# Patient Record
Sex: Female | Born: 1975
Health system: Southern US, Community
[De-identification: ages and names within clinical notes are randomized; demographics above are authoritative.]

## PROBLEM LIST (undated history)

## (undated) DIAGNOSIS — J45909 Unspecified asthma, uncomplicated: Secondary | ICD-10-CM

## (undated) DIAGNOSIS — D649 Anemia, unspecified: Secondary | ICD-10-CM

## (undated) DIAGNOSIS — J40 Bronchitis, not specified as acute or chronic: Secondary | ICD-10-CM

## (undated) DIAGNOSIS — T7840XA Allergy, unspecified, initial encounter: Secondary | ICD-10-CM

## (undated) HISTORY — DX: Anemia, unspecified: D64.9

## (undated) HISTORY — PX: TUBAL LIGATION: SHX77

---

## 1998-10-06 ENCOUNTER — Ambulatory Visit (HOSPITAL_COMMUNITY): Admission: RE | Admit: 1998-10-06 | Discharge: 1998-10-06 | Payer: Self-pay | Admitting: Obstetrics and Gynecology

## 1998-11-29 ENCOUNTER — Encounter (HOSPITAL_COMMUNITY): Admission: RE | Admit: 1998-11-29 | Discharge: 1999-02-08 | Payer: Self-pay | Admitting: *Deleted

## 1999-01-11 ENCOUNTER — Inpatient Hospital Stay (HOSPITAL_COMMUNITY): Admission: AD | Admit: 1999-01-11 | Discharge: 1999-01-11 | Payer: Self-pay | Admitting: Obstetrics and Gynecology

## 1999-02-07 ENCOUNTER — Inpatient Hospital Stay (HOSPITAL_COMMUNITY): Admission: AD | Admit: 1999-02-07 | Discharge: 1999-02-09 | Payer: Self-pay | Admitting: Obstetrics and Gynecology

## 1999-09-12 ENCOUNTER — Emergency Department (HOSPITAL_COMMUNITY): Admission: EM | Admit: 1999-09-12 | Discharge: 1999-09-12 | Payer: Self-pay | Admitting: Emergency Medicine

## 1999-09-12 ENCOUNTER — Encounter: Payer: Self-pay | Admitting: Emergency Medicine

## 2000-04-24 ENCOUNTER — Emergency Department (HOSPITAL_COMMUNITY): Admission: EM | Admit: 2000-04-24 | Discharge: 2000-04-24 | Payer: Self-pay | Admitting: Emergency Medicine

## 2000-04-24 ENCOUNTER — Encounter: Payer: Self-pay | Admitting: Internal Medicine

## 2001-09-10 ENCOUNTER — Emergency Department (HOSPITAL_COMMUNITY): Admission: EM | Admit: 2001-09-10 | Discharge: 2001-09-10 | Payer: Self-pay | Admitting: Emergency Medicine

## 2002-02-11 ENCOUNTER — Emergency Department (HOSPITAL_COMMUNITY): Admission: EM | Admit: 2002-02-11 | Discharge: 2002-02-11 | Payer: Self-pay | Admitting: Emergency Medicine

## 2002-11-23 ENCOUNTER — Encounter: Payer: Self-pay | Admitting: Emergency Medicine

## 2002-11-23 ENCOUNTER — Emergency Department (HOSPITAL_COMMUNITY): Admission: EM | Admit: 2002-11-23 | Discharge: 2002-11-23 | Payer: Self-pay | Admitting: Emergency Medicine

## 2003-01-18 ENCOUNTER — Emergency Department (HOSPITAL_COMMUNITY): Admission: EM | Admit: 2003-01-18 | Discharge: 2003-01-18 | Payer: Self-pay | Admitting: Emergency Medicine

## 2003-04-29 ENCOUNTER — Other Ambulatory Visit: Admission: RE | Admit: 2003-04-29 | Discharge: 2003-04-29 | Payer: Self-pay | Admitting: Obstetrics and Gynecology

## 2003-05-10 ENCOUNTER — Ambulatory Visit (HOSPITAL_COMMUNITY): Admission: RE | Admit: 2003-05-10 | Discharge: 2003-05-10 | Payer: Self-pay | Admitting: Obstetrics and Gynecology

## 2004-02-23 ENCOUNTER — Emergency Department (HOSPITAL_COMMUNITY): Admission: EM | Admit: 2004-02-23 | Discharge: 2004-02-23 | Payer: Self-pay | Admitting: Emergency Medicine

## 2004-11-28 ENCOUNTER — Other Ambulatory Visit: Admission: RE | Admit: 2004-11-28 | Discharge: 2004-11-28 | Payer: Self-pay | Admitting: Obstetrics and Gynecology

## 2005-02-20 ENCOUNTER — Emergency Department (HOSPITAL_COMMUNITY): Admission: EM | Admit: 2005-02-20 | Discharge: 2005-02-20 | Payer: Self-pay | Admitting: Emergency Medicine

## 2005-05-25 ENCOUNTER — Emergency Department (HOSPITAL_COMMUNITY): Admission: EM | Admit: 2005-05-25 | Discharge: 2005-05-25 | Payer: Self-pay | Admitting: Emergency Medicine

## 2012-03-17 ENCOUNTER — Encounter (HOSPITAL_COMMUNITY): Payer: Self-pay | Admitting: *Deleted

## 2012-03-17 ENCOUNTER — Emergency Department (HOSPITAL_COMMUNITY)
Admission: EM | Admit: 2012-03-17 | Discharge: 2012-03-17 | Disposition: A | Discharge status: Home / Self Care | Payer: 59 | Attending: Emergency Medicine | Admitting: Emergency Medicine

## 2012-03-17 DIAGNOSIS — Y92838 Other recreation area as the place of occurrence of the external cause: Secondary | ICD-10-CM | POA: Insufficient documentation

## 2012-03-17 DIAGNOSIS — S43499A Other sprain of unspecified shoulder joint, initial encounter: Secondary | ICD-10-CM | POA: Insufficient documentation

## 2012-03-17 DIAGNOSIS — X503XXA Overexertion from repetitive movements, initial encounter: Secondary | ICD-10-CM | POA: Insufficient documentation

## 2012-03-17 DIAGNOSIS — Y998 Other external cause status: Secondary | ICD-10-CM | POA: Insufficient documentation

## 2012-03-17 DIAGNOSIS — S46819A Strain of other muscles, fascia and tendons at shoulder and upper arm level, unspecified arm, initial encounter: Secondary | ICD-10-CM

## 2012-03-17 DIAGNOSIS — Y9389 Activity, other specified: Secondary | ICD-10-CM | POA: Insufficient documentation

## 2012-03-17 DIAGNOSIS — Y9239 Other specified sports and athletic area as the place of occurrence of the external cause: Secondary | ICD-10-CM | POA: Insufficient documentation

## 2012-03-17 MED ORDER — IBUPROFEN 800 MG PO TABS: 800.0000 mg | ORAL_TABLET | Freq: Three times a day (TID) | ORAL | Status: AC | PRN

## 2012-03-17 MED ORDER — CYCLOBENZAPRINE HCL 10 MG PO TABS: 10.0000 mg | ORAL_TABLET | Freq: Three times a day (TID) | ORAL | Status: AC | PRN

## 2012-03-17 NOTE — ED Provider Notes (Signed)
Medical screening examination/treatment/procedure(s) were performed by non-physician practitioner and as supervising physician I was immediately available for consultation/collaboration.  Olivia Mackie, MD 03/17/12 2134

## 2012-03-17 NOTE — ED Provider Notes (Signed)
History     CSN: [id number]  Arrival date & time 03/17/12  0159   First MD Initiated Contact with Patient 03/17/12 0515      Chief Complaint  Patient presents with  . Neck Pain   HPI  History provided by the patient. Patient is a 36 year old female with no significant PMH who presents with complaints of left-sided neck back pain. Patient states pain began at 8 AM yesterday morning and has gradually increased throughout the day. Patient has occasional sharp pains radiating down left shoulder and posterior arm near the triceps. She denies any numbness or weakness of the hand. Patient denies having any similar symptoms previously. Patient works for [employer] and reports performing repetitive motions and lifting with both arms as well as repetitive bending of the neck. She has never had similar symptoms previously from working. Patient is unsure if she had any abnormal sleep the night before her symptoms began. Patient has tried to rest the area without significant improvement. She denies any other treatments. She denies any other associated symptoms.    History reviewed. No pertinent past medical history.  Past Surgical History  Procedure Date  . Tubal ligation     History reviewed. No pertinent family history.  History  Substance Use Topics  . Smoking status: Never Smoker   . Smokeless tobacco: Not on file  . Alcohol Use: No    OB History    Grav Para Term Preterm Abortions TAB SAB Ect Mult Living                  Review of Systems  Constitutional: Negative for fever and chills.  HENT: Positive for neck pain. Negative for neck stiffness.   Eyes: Negative for visual disturbance.  Respiratory: Negative for shortness of breath.   Gastrointestinal: Negative for nausea, vomiting and abdominal pain.  Musculoskeletal: Positive for back pain.  Neurological: Negative for weakness, light-headedness, numbness and headaches.    Allergies  Review of patient's allergies  indicates no known allergies.  Home Medications  No current outpatient prescriptions on file.  BP 111/70  Pulse 64  Temp 98.2 F (36.8 C) (Oral)  Resp 18  SpO2 100%  LMP 02/24/2012  Physical Exam  Nursing note and vitals reviewed. Constitutional: She is oriented to person, place, and time. She appears well-developed and well-nourished. No distress.  HENT:  Head: Normocephalic.  Neck: Normal range of motion. Neck supple.    Cardiovascular: Normal rate and regular rhythm.   No murmur heard. Pulmonary/Chest: Effort normal and breath sounds normal. No respiratory distress. She has no wheezes. She has no rales.  Musculoskeletal:       Cervical back: She exhibits normal range of motion and no bony tenderness.       Back:  Neurological: She is alert and oriented to person, place, and time. She has normal strength. No sensory deficit.       Normal and equal strength of upper extremities bilaterally.  Skin: Skin is warm and dry. No rash noted. No erythema.  Psychiatric: She has a normal mood and affect. Her behavior is normal.    ED Course  Procedures     1. Trapezius muscle strain       MDM  5:20 AM patient seen and evaluated. Patient with no significant injury or trauma. Patient does do repetitive movements and lifting with both arms and frequent bending of neck. Patient is not have any spinal tenderness. She has tenderness along left trapezius. No neurologic  deficits of left hand or arm. This time suspect muscle irritation and strain. Will give symptomatic treatment.        Angus Seller, [place] 03/17/12 [id number]

## 2012-03-17 NOTE — Discharge Instructions (Signed)
You were seen and evaluated for your left neck and back pains. At this time your providers do not feel your symptoms cause from any concerning condition. Your providers to feel you have muscle strain and soreness. You've been given medications to help with symptoms. Use heat and ice for comfort. Please followup with a primary care provider.   Muscle Strain A muscle strain, or pulled muscle, occurs when a muscle is over-stretched. A small number of muscle fibers may also be torn. This is especially common in athletes. This happens when a sudden violent force placed on a muscle pushes it past its capacity. Usually, recovery from a pulled muscle takes 1 to 2 weeks. But complete healing will take 5 to 6 weeks. There are millions of muscle fibers. Following injury, your body will usually return to normal quickly. HOME CARE INSTRUCTIONS   While awake, apply ice to the sore muscle for 15 to 20 minutes each hour for the first 2 days. Put ice in a plastic bag and place a towel between the bag of ice and your skin.   Do not use the pulled muscle for several days. Do not use the muscle if you have pain.   You may wrap the injured area with an elastic bandage for comfort. Be careful not to bind it too tightly. This may interfere with blood circulation.   Only take over-the-counter or prescription medicines for pain, discomfort, or fever as directed by your caregiver. Do not use aspirin as this will increase bleeding (bruising) at injury site.   Warming up before exercise helps prevent muscle strains.  SEEK MEDICAL CARE IF:  There is increased pain or swelling in the affected area. MAKE SURE YOU:   Understand these instructions.   Will watch your condition.   Will get help right away if you are not doing well or get worse.  Document Released: 06/18/2005 Document Revised: 06/07/2011 Document Reviewed: 01/15/2007 [facility] Patient Information 2012 [place], [place].     RESOURCE GUIDE  Chronic Pain  Problems: Contact [facility]  [phone] Patients need to be referred by their primary care doctor.  Insufficient Money for Medicine: Contact United Way:  call "211" or [facility] [phone].  No Primary Care Doctor: - Call [facility]  [phone] - can help you locate a primary care doctor that  accepts your insurance, provides certain services, etc. - Physician Referral [phone]  Agencies that provide inexpensive medical care: - [facility]  [phone] - [facility]  [phone] - [facility]  [phone] [facility]  [phone] - [facility]  [phone] [facility]  [phone]  Medicaid-accepting [facility] Providers: - [facility]- [address]  [phone], Mon-Fri 9am-7pm, Sat 9am-1pm - [facility]- [address] [place], [address] [place]  [phone] - [facility]- [address], [address] [place]  [phone] [facility]- [address]  [phone] - Renaye Rakers- [address] [place], [address], [phone]  Only accepts [place] patients after they have their name  applied to their card  Self Pay (no insurance) in [place]: - Sickle Cell Patients: Dr Willey Blade, [facility]  [address] [place], [phone] - [facility]- [address] [place]  [phone]       [facility] [phone] [place]  [address]       -     [place] [employer]- see information above (Speak to [employer] if you do not have insurance)       -  [facility]- [address] [place], [phone]       -  [facility]- [address],  [phone]       -  [facility]- [address], [phone]       -  Dr Julio Sicks-  [address], [place], [phone]       -  [facility]- [address] [phone]       -   [facility]- [address] [phone], also [address] [phone]       -    [facility]- [address] [place], [phone], 1st & 3rd Saturday   every month, 10am-1pm  1) Find a Doctor and Pay Out of Pocket Although you won't have to find out who is covered by your insurance plan, it is a good idea to ask around and get recommendations. You will then need to call the office and see if the doctor you have chosen will accept you as a new patient and what types of options they offer for patients who are self-pay. Some doctors offer discounts or will set up payment plans for their patients who do not have insurance, but you will need to ask so you aren't surprised when you get to your appointment.  2) Contact Your Local Health Department Not all health departments have doctors that can see patients for sick visits, but many do, so it is worth a call to see if yours does. If you don't know where your local health department is, you can check in your phone book. The CDC also has a tool to help you locate your state's health department, and many state websites also have listings of all of their local health departments.  3) Find a Walk-in Clinic If your illness is not likely to be very severe or complicated, you may want to try a walk in clinic. These are popping up all over the country in pharmacies, drugstores, and shopping centers. They're usually staffed by nurse practitioners or physician assistants that have been trained to treat common illnesses and complaints. They're usually fairly quick and inexpensive. However, if you have serious medical issues or chronic medical problems, these are probably not your best option  STD Testing - [facility] Department of [facility], STD Clinic, [address] [place], phone [phone] or [phone].  Monday - Friday, call for an appointment. [facility] - [facility] Department of [employer],  STD Clinic, [address], [place], phone [phone] or [phone].  Monday - Friday, call for an appointment.  Abuse/Neglect: [facility] Child Abuse Hotline (810) 326-8285 [address] Child Abuse Hotline (579) 652-1887 (After Hours)  Emergency Shelter:  [facility] (904)698-8124  Maternity Homes: - [facility] [place] [facility] (445) 122-2353 - [facility] [phone]  MRSA Hotline #:   220-636-2541  [facility] Resources  [facility] [place]  [employer] [facility]. [address]                 [address]         [address] [place] [address] [place]  Blondell Reveal Phone:  [phone]                                  Phone:  [phone]                   Phone:  [phone]  [facility], [phone] - [facility] - [phone]       -     [facility] in [place], [address]                                  9151444992, [facility] Child Abuse Hotline (949)172-9116 or 805 137 4789 (After Hours)   Behavioral Health Services  Substance Abuse Resources: - [facility] and Drug Services  ([phone] - [facility] [phone] - [facility] [place] [phone] Floydene Flock [phone] - Residential & Outpatient Substance Abuse Program  ([phone]  Psychological Services: [facility]  [phone] [facility] Services  [phone] - [facility], [phone] [place]. [address], [place], ACCESS LINE: [phone] or [phone], [employer]za  Dental Assistance  If unable to pay or uninsured, contact:  [facility] or [facility]. to become qualified for the adult dental clinic.  Patients with Medicaid: [facility] [phone] [address], [phone] [address] [phone]  If unable to pay, or uninsured, contact [facility] [phone]) or [facility] [phone] in [place], [phone] in [facility]) to become qualified for the adult dental clinic  Other Low-Cost Community Dental Services: - [facility]- [address] [place], [place], [place], [address], [phone], [address]. [phone], 2nd and 4th Thursday of the month at 6:30am.  10 clients each day by appointment, can sometimes see walk-in patients if someone does not show for an appointment. [facility]- [address] [place], [place], [address], [phone] - [facility]- [address] [place], [place], [address], [phone] - [facility]- [phone] [facility] Department- [phone] [facility] : [facility] Department- [phone]

## 2012-03-17 NOTE — ED Notes (Signed)
Pt c/o L neck pain radiating down L arm and L back. Pt works for [employer], does a lot of lifting, denies overt injury at this time.

## 2012-03-21 ENCOUNTER — Emergency Department (HOSPITAL_COMMUNITY)
Admission: EM | Admit: 2012-03-21 | Discharge: 2012-03-22 | Disposition: A | Discharge status: Home / Self Care | Payer: 59 | Attending: Emergency Medicine | Admitting: Emergency Medicine

## 2012-03-21 DIAGNOSIS — M542 Cervicalgia: Secondary | ICD-10-CM

## 2012-03-21 DIAGNOSIS — M25519 Pain in unspecified shoulder: Secondary | ICD-10-CM | POA: Insufficient documentation

## 2012-03-21 DIAGNOSIS — M79609 Pain in unspecified limb: Secondary | ICD-10-CM | POA: Insufficient documentation

## 2012-03-21 NOTE — ED Notes (Signed)
Pt c/o pain to L side of neck that radiates down L side and L arm since Monday. Pt states she works at [employer] and does a lot of pushing, pulling and lifting and thinks she may have injured herself at work. Pt was seen in ED for same on Monday. Pt was given Flexeril and Motrin and she states this only works temporarily. ROM intact to L side.

## 2012-03-22 MED ORDER — TRAMADOL HCL 50 MG PO TABS: 50.0000 mg | ORAL_TABLET | Freq: Four times a day (QID) | ORAL | Status: DC | PRN

## 2012-03-22 MED ORDER — OXYCODONE-ACETAMINOPHEN 5-325 MG PO TABS
2.0000 | ORAL_TABLET | Freq: Once | ORAL | Status: DC
  Filled 2012-03-22: qty 2

## 2012-03-22 MED ORDER — METHOCARBAMOL 100 MG/ML IJ SOLN
500.0000 mg | Freq: Once | INTRAMUSCULAR | Status: AC
  Administered 2012-03-22: 500 mg via INTRAMUSCULAR
  Filled 2012-03-22: qty 5

## 2012-03-22 MED ORDER — OXYCODONE-ACETAMINOPHEN 5-325 MG PO TABS: 1.0000 | ORAL_TABLET | ORAL | Status: DC | PRN

## 2012-03-22 MED ORDER — METHOCARBAMOL 500 MG PO TABS: 500.0000 mg | ORAL_TABLET | Freq: Two times a day (BID) | ORAL | Status: DC

## 2012-03-22 MED ORDER — METHOCARBAMOL 100 MG/ML IJ SOLN: 500.0000 mg | Freq: Once | INTRAMUSCULAR | Status: DC

## 2012-03-22 NOTE — ED Notes (Signed)
Pt instructed to lay supine for 15 min as per Pharmacy recommendations.

## 2012-03-22 NOTE — ED Provider Notes (Signed)
History     CSN: [id number]  Arrival date & time 03/21/12  2009   First MD Initiated Contact with Patient 03/21/12 2250      Chief Complaint  Patient presents with  . Neck Pain    (Consider location/radiation/quality/duration/timing/severity/associated sxs/prior treatment) HPI Comments: Marie Kramer 36 y.o. female   The chief complaint is: Patient presents with:   Neck Pain   The patient has medical history significant for:   No past medical history on file.  Patient presents with left neck pain with transmission to the left shoulder and arm. 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 Patient states she took some flexeril and motrin with some improvement. Denies fever or chills. Denies NVD or abdominal pain. Denies numbness or tingling. Denies headache. Denies recent head or neck trauma.      The history is provided by the patient.    No past medical history on file.  Past Surgical History  Procedure Date  . Tubal ligation     No family history on file.  History  Substance Use Topics  . Smoking status: Never Smoker   . Smokeless tobacco: Not on file  . Alcohol Use: No    OB History    Grav Para Term Preterm Abortions TAB SAB Ect Mult Living                  Review of Systems  Constitutional: Negative for fever and chills.  HENT: Positive for neck pain.   Gastrointestinal: Negative for nausea, vomiting, abdominal pain and diarrhea.  Musculoskeletal: Positive for arthralgias.  Neurological: Negative for headaches.  All other systems reviewed and are negative.    Allergies  Review of patient's allergies indicates no known allergies.  Home Medications   Current Outpatient Rx  Name Route Sig Dispense Refill  . CYCLOBENZAPRINE HCL 10 MG PO TABS Oral Take 1 tablet (10 mg total) by mouth 3 (three) times daily as needed for muscle spasms. 30 tablet 0  . IBUPROFEN 800 MG PO  TABS Oral Take 1 tablet (800 mg total) by mouth every 8 (eight) hours as needed for pain. 30 tablet 0    BP 99/72  Pulse 58  Temp 98.6 F (37 C) (Oral)  Resp 16  SpO2 100%  LMP 02/24/2012  Physical Exam  Vitals reviewed. Constitutional: She appears well-developed and well-nourished. She appears distressed.  HENT:  Head: Normocephalic and atraumatic.  Mouth/Throat: Oropharynx is clear and moist.  Eyes: Conjunctivae normal and EOM are normal. Pupils are equal, round, and reactive to light. No scleral icterus.  Neck: Normal range of motion. Neck supple.  Cardiovascular: Regular rhythm and normal heart sounds.   Pulmonary/Chest: Effort normal.  Abdominal: Soft. Bowel sounds are normal. There is no tenderness.  Musculoskeletal: Normal range of motion. She exhibits tenderness.       Tenderness to palpation of the lateral aspect of left side of neck. Tenderness to palpation of left shoulder.  Neurological: She is alert.  Skin: Skin is warm and dry.    ED Course  Procedures (including critical care time)  Labs Reviewed - No data to display No results found.   1. Neck pain       MDM  Patient presented with neck pain secondary to strain at work. Patient given muscle relaxer in ED with some improvement. Patient discharged on pain medication and muscle relaxer. No red flags for meningitis or  cervical fracture. Return precautions given verbally and in discharge summary.        Pixie Casino, PA-C 03/22/12 [phone]

## 2012-03-23 NOTE — ED Provider Notes (Signed)
Medical screening examination/treatment/procedure(s) were performed by non-physician practitioner and as supervising physician I was immediately available for consultation/collaboration.  Donnetta Hutching, MD 03/23/12 [phone]

## 2012-12-05 ENCOUNTER — Encounter (HOSPITAL_COMMUNITY): Payer: Self-pay | Admitting: Emergency Medicine

## 2012-12-05 ENCOUNTER — Ambulatory Visit (HOSPITAL_COMMUNITY)
Admit: 2012-12-05 | Discharge: 2012-12-05 | Disposition: A | Discharge status: Home / Self Care | Payer: 59 | Attending: Emergency Medicine | Admitting: Emergency Medicine

## 2012-12-05 ENCOUNTER — Emergency Department (HOSPITAL_COMMUNITY)
Admission: EM | Admit: 2012-12-05 | Discharge: 2012-12-05 | Disposition: A | Discharge status: Home / Self Care | Payer: 59 | Source: Home / Self Care | Attending: Emergency Medicine | Admitting: Emergency Medicine

## 2012-12-05 DIAGNOSIS — R609 Edema, unspecified: Secondary | ICD-10-CM

## 2012-12-05 DIAGNOSIS — R600 Localized edema: Secondary | ICD-10-CM

## 2012-12-05 DIAGNOSIS — M7989 Other specified soft tissue disorders: Secondary | ICD-10-CM | POA: Insufficient documentation

## 2012-12-05 LAB — POCT I-STAT, CHEM 8
BUN: 12 mg/dL (ref 6–23)
Calcium, Ion: 1.3 mmol/L — ABNORMAL HIGH (ref 1.12–1.23)
Creatinine, Ser: 0.7 mg/dL (ref 0.50–1.10)
Glucose, Bld: 87 mg/dL (ref 70–99)
Potassium: 4 mEq/L (ref 3.5–5.1)
TCO2: 28 mmol/L (ref 0–100)

## 2012-12-05 LAB — POCT URINALYSIS DIP (DEVICE)
Ketones, ur: NEGATIVE mg/dL
pH: 6 (ref 5.0–8.0)

## 2012-12-05 LAB — D-DIMER, QUANTITATIVE: D-Dimer, Quant: 0.58 ug/mL-FEU — ABNORMAL HIGH (ref 0.00–0.48)

## 2012-12-05 NOTE — ED Notes (Signed)
Patient going for [place] and will return to [facility]

## 2012-12-05 NOTE — ED Provider Notes (Signed)
Medical screening examination/treatment/procedure(s) were performed by non-physician practitioner and as supervising physician I was immediately available for consultation/collaboration.  Leslee Home, M.D.  Reuben Likes, MD 12/05/12 2209

## 2012-12-05 NOTE — ED Provider Notes (Signed)
History     CSN: [id number]  Arrival date & time 12/05/12  1446   First MD Initiated Contact with Patient 12/05/12 1759      Chief Complaint  Patient presents with  . Leg Swelling    (Consider location/radiation/quality/duration/timing/severity/associated sxs/prior treatment) HPI Comments: Pt denies injury.  Reports BLE swelling, worse in RLE. Only pain is in r ankle with standing and walking.  Swelling is from knees to feet.   Patient is a 37 y.o. female presenting with extremity pain. The history is provided by the patient.  Extremity Pain This is a new problem. Episode onset: 1 week ago. The problem occurs constantly. The problem has been gradually worsening. The symptoms are aggravated by walking and standing. Relieved by: elevating lower legs. Treatments tried: elevating lower legs. Improvement on treatment: moderate transient improvement.    History reviewed. No pertinent past medical history.  Past Surgical History  Procedure Laterality Date  . Tubal ligation      History reviewed. No pertinent family history.  History  Substance Use Topics  . Smoking status: Never Smoker   . Smokeless tobacco: Not on file  . Alcohol Use: No    OB History   Grav Para Term Preterm Abortions TAB SAB Ect Mult Living                  Review of Systems  Constitutional: Negative for fever and chills.  Musculoskeletal:       R ankle pain, BLE swelling  Neurological: Negative for weakness and numbness.    Allergies  Review of patient's allergies indicates no known allergies.  Home Medications   Current Outpatient Rx  Name  Route  Sig  Dispense  Refill  . methocarbamol (ROBAXIN) 500 MG tablet   Oral   Take 1 tablet (500 mg total) by mouth 2 (two) times daily.   20 tablet   0   . oxyCODONE-acetaminophen (PERCOCET/ROXICET) 5-325 MG per tablet   Oral   Take 1 tablet by mouth every 4 (four) hours as needed for pain.   15 tablet   0   . traMADol (ULTRAM) 50 MG tablet  Oral   Take 1 tablet (50 mg total) by mouth every 6 (six) hours as needed for pain.   15 tablet   0     BP 116/74  Pulse 61  Temp(Src) 98.1 F (36.7 C) (Oral)  Resp 17  SpO2 98%  LMP 12/05/2012  Physical Exam  Constitutional: She appears well-developed and well-nourished. No distress.  Cardiovascular:  Pulses:      Dorsalis pedis pulses are 2+ on the right side, and 2+ on the left side.  Musculoskeletal:       Right ankle: She exhibits swelling. She exhibits normal range of motion, no deformity and normal pulse. Tenderness.       Right lower leg: She exhibits tenderness and edema. She exhibits no bony tenderness.       Left lower leg: She exhibits tenderness and edema. She exhibits no bony tenderness.  Skin: Skin is warm, dry and intact. No erythema.  No wounds, erythema or warmth to BLE    ED Course  Procedures (including critical care time)  Labs Reviewed  D-DIMER, QUANTITATIVE - Abnormal; Notable for the following:    D-Dimer, Quant 0.58 (*)    All other components within normal limits  POCT I-STAT, CHEM 8 - Abnormal; Notable for the following:    Calcium, Ion 1.30 (*)    All other components  within normal limits  POCT URINALYSIS DIP (DEVICE) - Abnormal; Notable for the following:    Hgb urine dipstick TRACE (*)    All other components within normal limits   No results found.   1. Lower extremity edema       MDM  D-dimer positive, pt sent for B venous doppler of BLE which were negative. Discussed home mgmt measures of venous stasis.         Cathlyn Parsons, NP 12/05/12 2141

## 2012-12-05 NOTE — ED Notes (Signed)
Right leg and ankle swelling and pain.  Patient has been elevating leg, swelling does go down, but with dependant positioning, swelling increases.  Denies injury.

## 2012-12-15 NOTE — Progress Notes (Signed)
VASCULAR LAB PRELIMINARY  PRELIMINARY  PRELIMINARY  PRELIMINARY  Bilateral lower extremity venous duplex completed.    Preliminary report:  Bilateral:  No evidence of DVT, superficial thrombosis, or Baker's Cyst.   Charlette Hennings, RVS 12/15/2012, 11:52 AM

## 2013-09-06 ENCOUNTER — Emergency Department (HOSPITAL_COMMUNITY): Payer: 59

## 2013-09-06 ENCOUNTER — Encounter (HOSPITAL_COMMUNITY): Payer: Self-pay | Admitting: Emergency Medicine

## 2013-09-06 ENCOUNTER — Emergency Department (HOSPITAL_COMMUNITY)
Admission: EM | Admit: 2013-09-06 | Discharge: 2013-09-06 | Disposition: A | Discharge status: Home / Self Care | Payer: 59 | Attending: Emergency Medicine | Admitting: Emergency Medicine

## 2013-09-06 DIAGNOSIS — J45909 Unspecified asthma, uncomplicated: Secondary | ICD-10-CM | POA: Insufficient documentation

## 2013-09-06 DIAGNOSIS — J40 Bronchitis, not specified as acute or chronic: Secondary | ICD-10-CM

## 2013-09-06 DIAGNOSIS — J3489 Other specified disorders of nose and nasal sinuses: Secondary | ICD-10-CM | POA: Insufficient documentation

## 2013-09-06 HISTORY — DX: Unspecified asthma, uncomplicated: J45.909

## 2013-09-06 MED ORDER — AZITHROMYCIN 250 MG PO TABS: 250.0000 mg | ORAL_TABLET | Freq: Every day | ORAL | Status: DC

## 2013-09-06 MED ORDER — AZITHROMYCIN 250 MG PO TABS
500.0000 mg | ORAL_TABLET | Freq: Once | ORAL | Status: AC
  Administered 2013-09-06: 500 mg via ORAL
  Filled 2013-09-06: qty 2

## 2013-09-06 MED ORDER — HYDROCOD POLST-CHLORPHEN POLST 10-8 MG/5ML PO LQCR: 5.0000 mL | Freq: Two times a day (BID) | ORAL | Status: DC | PRN

## 2013-09-06 NOTE — ED Provider Notes (Signed)
CSN: [id number]     Arrival date & time 09/06/13  2059 History   This chart was scribed for non-physician practitioner Arman Filter, NP, working with Doug Sou, MD, by Yevette Edwards, ED Scribe. This patient was seen in room WTR5/WTR5 and the patient's care was started at 10:09 PM. None    Chief Complaint  Patient presents with  . Cough    The history is provided by the patient. No language interpreter was used.   HPI Comments: Marie Kramer is a 38 y.o. female who presents to the Emergency Department complaining of a cough which has persisted for three weeks. The pt reports the cough is intermittently productive of yellowish brown phlegm. She has also experienced rhinorrhea and a fever two days ago. In the ED, her temperature is 98.2 F.She has treated the symptoms with Delsym Cough and Congestion without relief; she has been using the OTC for two weeks. The pt has a h/o asthma.   Past Medical History  Diagnosis Date  . Asthma    Past Surgical History  Procedure Laterality Date  . Tubal ligation     History reviewed. No pertinent family history. History  Substance Use Topics  . Smoking status: Never Smoker   . Smokeless tobacco: Not on file  . Alcohol Use: No   No OB history provided.  Review of Systems  Constitutional: Positive for fever (Resolved.).  HENT: Positive for rhinorrhea.   Respiratory: Positive for cough. Negative for shortness of breath and wheezing.   Gastrointestinal: Negative for vomiting.  All other systems reviewed and are negative.   Allergies  Review of patient's allergies indicates no known allergies.  Home Medications   Current Outpatient Rx  Name  Route  Sig  Dispense  Refill  . dextromethorphan (DELSYM) 30 MG/5ML liquid   Oral   Take 15 mg by mouth as needed for cough.         Marland Kitchen azithromycin (ZITHROMAX) 250 MG tablet   Oral   Take 1 tablet (250 mg total) by mouth daily.   4 tablet   0   . chlorpheniramine-HYDROcodone (TUSSIONEX  PENNKINETIC ER) 10-8 MG/5ML LQCR   Oral   Take 5 mLs by mouth every 12 (twelve) hours as needed for cough.   60 mL   0    Triage Vitals: BP 109/67  Pulse 75  Temp(Src) 98.2 F (36.8 C) (Oral)  Resp 20  Ht 5\' 3"  (1.6 m)  Wt 195 lb (88.451 kg)  BMI 34.55 kg/m2  SpO2 100%  LMP 08/21/2013  Physical Exam  Nursing note and vitals reviewed. Constitutional: She is oriented to person, place, and time. She appears well-developed and well-nourished. No distress.  HENT:  Head: Normocephalic and atraumatic.  Right Ear: External ear normal.  Left Ear: External ear normal.  Nose: Rhinorrhea present. Right sinus exhibits no maxillary sinus tenderness and no frontal sinus tenderness. Left sinus exhibits no maxillary sinus tenderness and no frontal sinus tenderness.  Mouth/Throat: Oropharynx is clear and moist.  Eyes: Conjunctivae and EOM are normal. Right eye exhibits no discharge. Left eye exhibits no discharge.  Neck: Neck supple. No tracheal deviation present.  Cardiovascular: Normal rate, regular rhythm and normal heart sounds.  Exam reveals no gallop and no friction rub.   No murmur heard. Pulmonary/Chest: Effort normal and breath sounds normal. No respiratory distress. She has no wheezes. She has no rales. She exhibits no tenderness.  Musculoskeletal: Normal range of motion.  Neurological: She is alert and oriented  to person, place, and time.  Skin: Skin is warm and dry.  Psychiatric: She has a normal mood and affect. Her behavior is normal.     ED Course  Procedures (including critical care time)  DIAGNOSTIC STUDIES: Oxygen Saturation is 100% on room air, normal by my interpretation.    COORDINATION OF CARE:  10:13 PM- Discussed treatment plan with patient, which includes a chest x-ray, and the patient agreed to the plan.   Labs Review Labs Reviewed - No data to display Imaging Review Dg Chest 2 View  09/06/2013   CLINICAL DATA:  Cough.  EXAM: CHEST  2 VIEW  COMPARISON:   None.  FINDINGS: The heart size and mediastinal contours are within normal limits. Both lungs are clear. No pleural effusion or pneumothorax is noted. The visualized skeletal structures are unremarkable.  IMPRESSION: No acute cardiopulmonary abnormality seen.   Electronically Signed   By: Roque LiasJames  Green M.D.   On: 09/06/2013 23:22     EKG Interpretation None     Due to stated fevers and persistent cough with negative xray will treat with Z pack  MDM   Final diagnoses:  Bronchitis       I personally performed the services described in this documentation, which was scribed in my presence. The recorded information has been reviewed and is accurate.   Arman FilterGail K Gunhild Bautch, NP 09/06/13 [id number]

## 2013-09-06 NOTE — ED Notes (Signed)
Pt arrived to the ED with a complaint of a cough that has been persistent for three weeks.  Pt states she has been using OTC medications without relief.  Pt states the cough is minimally productive with yellow brown fleam.

## 2013-09-06 NOTE — Discharge Instructions (Signed)
Bronchitis °Bronchitis is swelling (inflammation) of the air tubes leading to your lungs (bronchi). This causes mucus and a cough. If the swelling gets bad, you may have trouble breathing. °HOME CARE  °· Rest. °· Drink enough fluids to keep your pee (urine) clear or pale yellow (unless you have a condition where you have to watch how much you drink). °· Only take medicine as told by your doctor. If you were given antibiotic medicines, finish them even if you start to feel better. °· Avoid smoke, irritating chemicals, and strong smells. These make the problem worse. Quit smoking if you smoke. This helps your lungs heal faster. °· Use a cool mist humidifier. Change the water in the humidifier every day. You can also sit in the bathroom with hot shower running for 5 10 minutes. Keep the door closed. °· See your health care provider as told. °· Wash your hands often. °GET HELP IF: °Your problems do not get better after 1 week. °GET HELP RIGHT AWAY IF:  °· Your fever gets worse. °· You have chills. °· Your chest hurts. °· Your problems breathing get worse. °· You have blood in your mucus. °· You pass out (faint). °· You feel lightheaded. °· You have a bad headache. °· You throw up (vomit) again and again. °MAKE SURE YOU: °· Understand these instructions. °· Will watch your condition. °· Will get help right away if you are not doing well or get worse. °Document Released: 12/05/2007 Document Revised: 04/08/2013 Document Reviewed: 02/10/2013 °[employer]® Patient Information ©2014 [employer]. ° °

## 2013-09-07 NOTE — ED Provider Notes (Signed)
Medical screening examination/treatment/procedure(s) were performed by non-physician practitioner and as supervising physician I was immediately available for consultation/collaboration.   EKG Interpretation None       Doug SouSam Jezlyn Westerfield, MD 09/07/13 [id number]

## 2014-06-06 IMAGING — CR DG CHEST 2V
2 series · 2 of 2 positions shown · non-contrast
Comparison: None.

CLINICAL DATA: Cough.

EXAM:
CHEST  2 VIEW

[w chest pa]
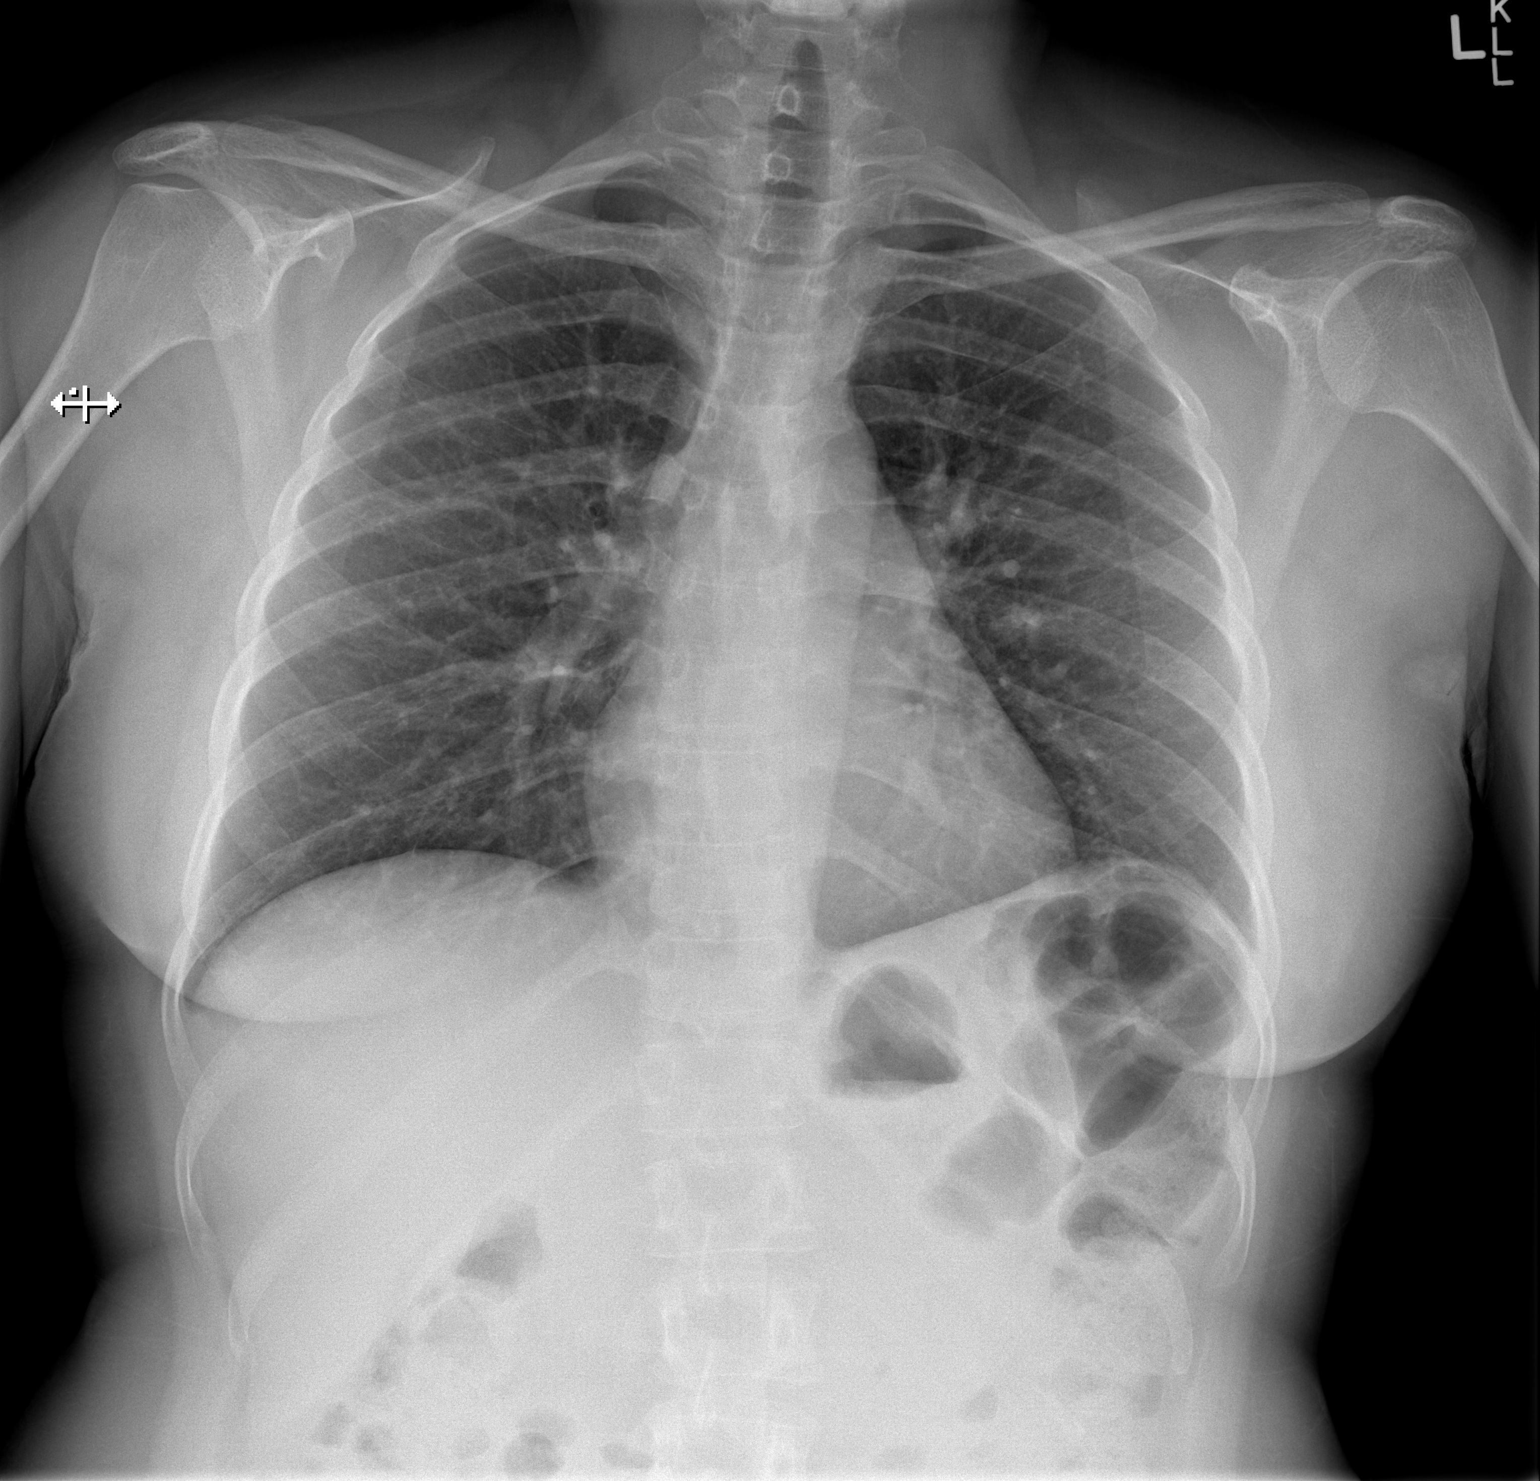

[w chest lat]
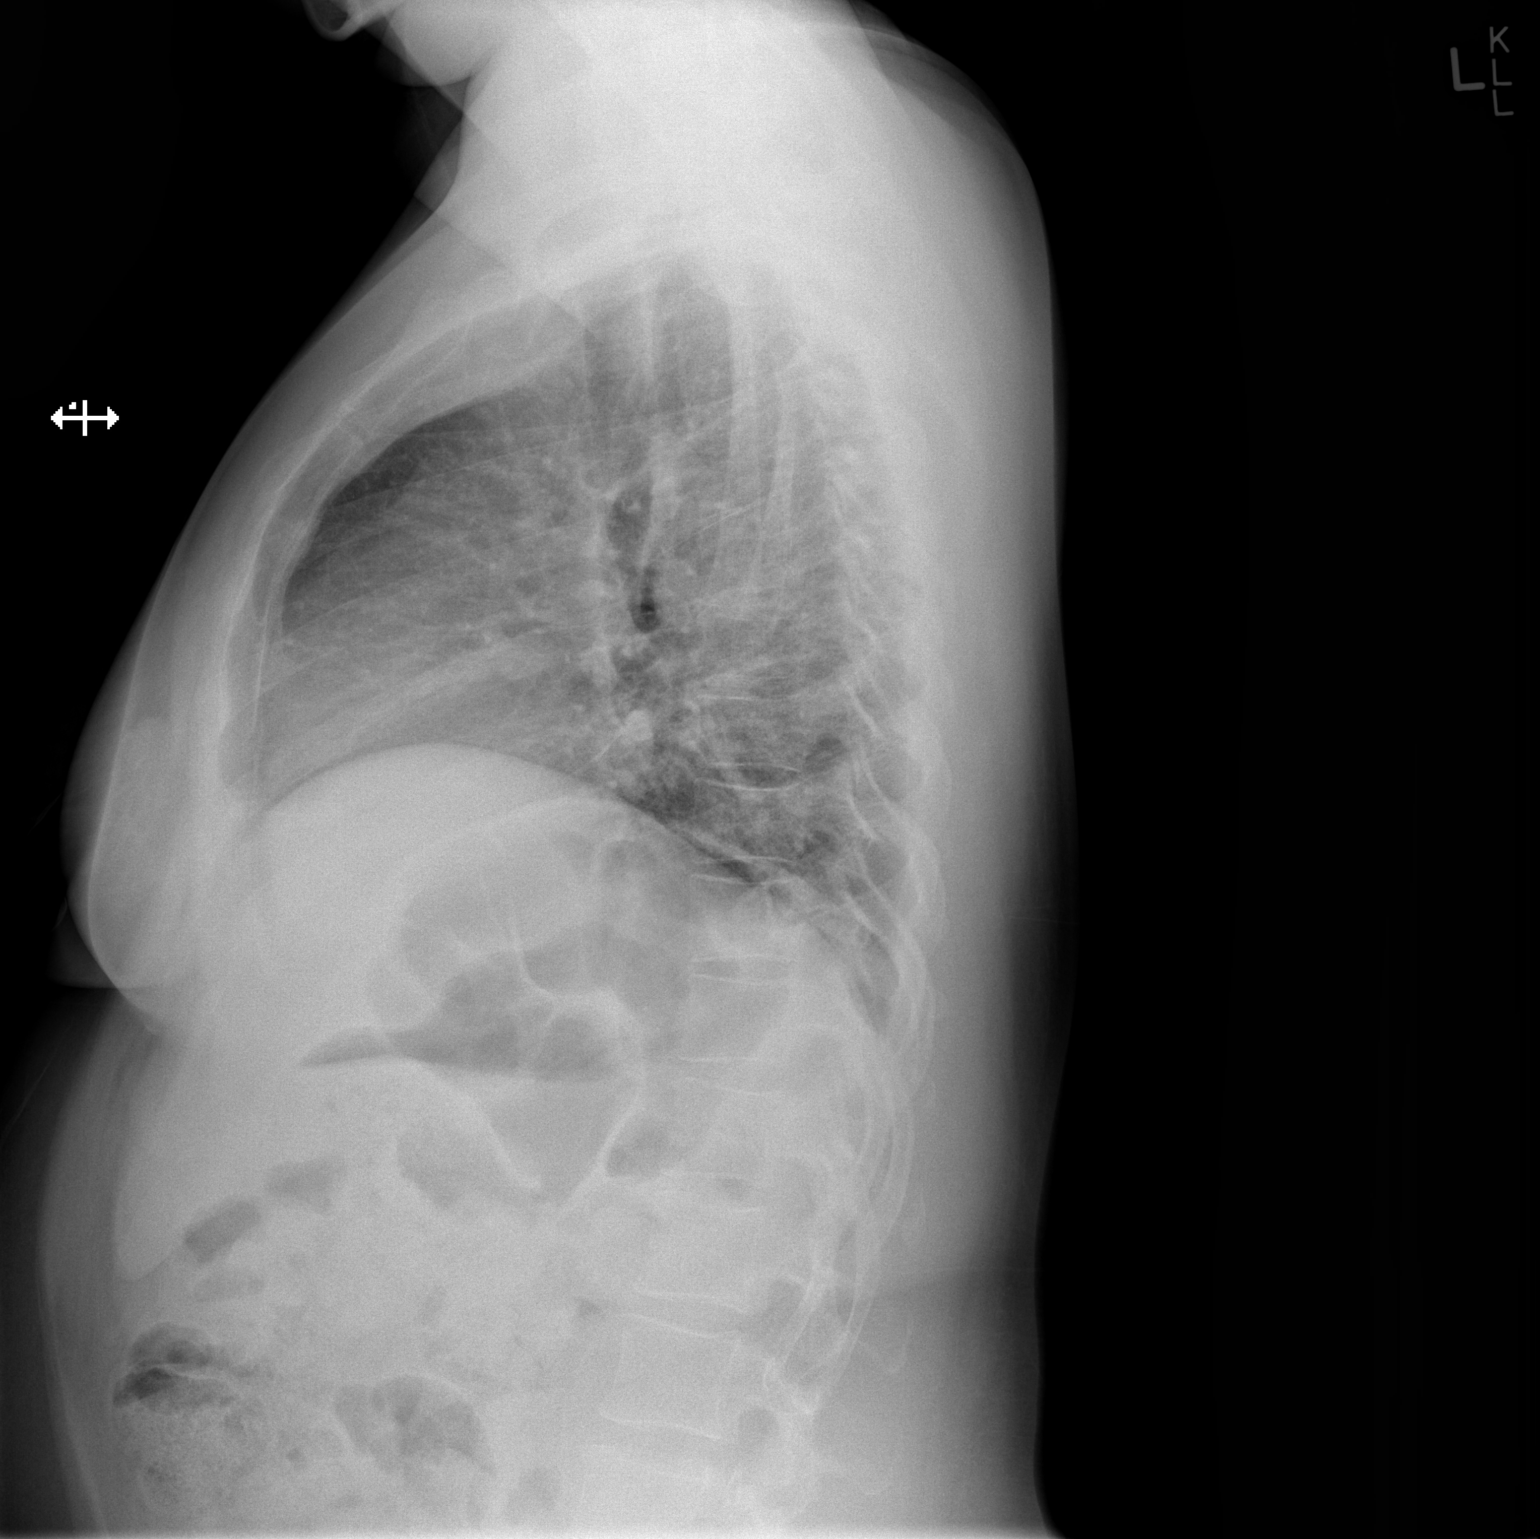

[2 of 2 positions shown; findings below may reference images not displayed]

FINDINGS: The heart size and mediastinal contours are within normal limits.
Both lungs are clear. No pleural effusion or pneumothorax is noted.
The visualized skeletal structures are unremarkable.
IMPRESSION: No acute cardiopulmonary abnormality seen.

## 2014-09-21 ENCOUNTER — Encounter (HOSPITAL_COMMUNITY): Payer: Self-pay

## 2014-09-21 ENCOUNTER — Emergency Department (HOSPITAL_COMMUNITY)
Admission: EM | Admit: 2014-09-21 | Discharge: 2014-09-21 | Disposition: A | Discharge status: Home / Self Care | Payer: Federal, State, Local not specified - PPO | Attending: Emergency Medicine | Admitting: Emergency Medicine

## 2014-09-21 ENCOUNTER — Emergency Department (HOSPITAL_COMMUNITY): Payer: Federal, State, Local not specified - PPO

## 2014-09-21 DIAGNOSIS — Z79899 Other long term (current) drug therapy: Secondary | ICD-10-CM | POA: Insufficient documentation

## 2014-09-21 DIAGNOSIS — Z79891 Long term (current) use of opiate analgesic: Secondary | ICD-10-CM | POA: Insufficient documentation

## 2014-09-21 DIAGNOSIS — M722 Plantar fascial fibromatosis: Secondary | ICD-10-CM | POA: Insufficient documentation

## 2014-09-21 DIAGNOSIS — M25572 Pain in left ankle and joints of left foot: Secondary | ICD-10-CM | POA: Diagnosis present

## 2014-09-21 DIAGNOSIS — J45909 Unspecified asthma, uncomplicated: Secondary | ICD-10-CM | POA: Diagnosis not present

## 2014-09-21 DIAGNOSIS — Z792 Long term (current) use of antibiotics: Secondary | ICD-10-CM | POA: Insufficient documentation

## 2014-09-21 DIAGNOSIS — M7662 Achilles tendinitis, left leg: Secondary | ICD-10-CM | POA: Diagnosis not present

## 2014-09-21 MED ORDER — NAPROXEN 500 MG PO TABS: 500.0000 mg | ORAL_TABLET | Freq: Two times a day (BID) | ORAL | Status: DC

## 2014-09-21 MED ORDER — NAPROXEN 500 MG PO TABS
500.0000 mg | ORAL_TABLET | Freq: Once | ORAL | Status: AC
  Administered 2014-09-21: 500 mg via ORAL
  Filled 2014-09-21: qty 1

## 2014-09-21 MED ORDER — HYDROCODONE-ACETAMINOPHEN 5-325 MG PO TABS: 1.0000 | ORAL_TABLET | Freq: Four times a day (QID) | ORAL | Status: DC | PRN

## 2014-09-21 NOTE — Discharge Instructions (Signed)
Freeze a water bottle and roll your foot on it for 20 minutes at a time to help stretch the plantar fascia and help with pain. Ice or heat the achilles tendon (behind your heel) to help with pain. Use naprosyn twice daily with meals for 7 days, then as needed thereafter. Use norco as directed as needed for severe pain. Call the [facility] in 1-2 weeks for ongoing pain. Wear supportive shoes and avoid high heels. Return to the ER for changes or worsening symptoms.   Plantar Fasciitis Plantar fasciitis is a common condition that causes foot pain. It is soreness (inflammation) of the band of tough fibrous tissue on the bottom of the foot that runs from the heel bone (calcaneus) to the ball of the foot. The cause of this soreness may be from excessive standing, poor fitting shoes, running on hard surfaces, being overweight, having an abnormal walk, or overuse (this is common in runners) of the painful foot or feet. It is also common in aerobic exercise dancers and ballet dancers. SYMPTOMS  Most people with plantar fasciitis complain of:  Severe pain in the morning on the bottom of their foot especially when taking the first steps out of bed. This pain recedes after a few minutes of walking.  Severe pain is experienced also during walking following a long period of inactivity.  Pain is worse when walking barefoot or up stairs DIAGNOSIS   Your caregiver will diagnose this condition by examining and feeling your foot.  Special tests such as X-rays of your foot, are usually not needed. PREVENTION   Consult a sports medicine professional before beginning a new exercise program.  Walking programs offer a good workout. With walking there is a lower chance of overuse injuries common to runners. There is less impact and less jarring of the joints.  Begin all new exercise programs slowly. If problems or pain develop, decrease the amount of time or distance until you are at a comfortable  level.  Wear good shoes and replace them regularly.  Stretch your foot and the heel cords at the back of the ankle (Achilles tendon) both before and after exercise.  Run or exercise on even surfaces that are not hard. For example, asphalt is better than pavement.  Do not run barefoot on hard surfaces.  If using a treadmill, vary the incline.  Do not continue to workout if you have foot or joint problems. Seek professional help if they do not improve. HOME CARE INSTRUCTIONS   Avoid activities that cause you pain until you recover.  Use ice or cold packs on the problem or painful areas after working out.  Only take over-the-counter or prescription medicines for pain, discomfort, or fever as directed by your caregiver.  Soft shoe inserts or athletic shoes with air or gel sole cushions may be helpful.  If problems continue or become more severe, consult a sports medicine caregiver or your own health care provider. Cortisone is a potent anti-inflammatory medication that may be injected into the painful area. You can discuss this treatment with your caregiver. MAKE SURE YOU:   Understand these instructions.  Will watch your condition.  Will get help right away if you are not doing well or get worse. Document Released: 03/13/2001 Document Revised: 09/10/2011 Document Reviewed: 05/12/2008 [facility] Patient Information 2015 [place], [place]. This information is not intended to replace advice given to you by your health care provider. Make sure you discuss any questions you have with your health care provider.  Tendinitis Tendinitis is swelling and inflammation of the tendons. Tendons are band-like tissues that connect muscle to bone. Tendinitis commonly occurs in the:   Shoulders (rotator cuff).  Heels (Achilles tendon).  Elbows (triceps tendon). CAUSES Tendinitis is usually caused by overusing the tendon, muscles, and joints involved. When the tissue surrounding a tendon (synovium)  becomes inflamed, it is called tenosynovitis. Tendinitis commonly develops in people whose jobs require repetitive motions. SYMPTOMS  Pain.  Tenderness.  Mild swelling. DIAGNOSIS Tendinitis is usually diagnosed by physical exam. Your health care provider may also order X-rays or other imaging tests. TREATMENT Your health care provider may recommend certain medicines or exercises for your treatment. HOME CARE INSTRUCTIONS   Use a sling or splint for as long as directed by your health care provider until the pain decreases.  Put ice on the injured area.  Put ice in a plastic bag.  Place a towel between your skin and the bag.  Leave the ice on for 15-20 minutes, 3-4 times a day, or as directed by your health care provider.  Avoid using the limb while the tendon is painful. Perform gentle range of motion exercises only as directed by your health care provider. Stop exercises if pain or discomfort increase, unless directed otherwise by your health care provider.  Only take over-the-counter or prescription medicines for pain, discomfort, or fever as directed by your health care provider. SEEK MEDICAL CARE IF:   Your pain and swelling increase.  You develop new, unexplained symptoms, especially increased numbness in the hands. MAKE SURE YOU:   Understand these instructions.  Will watch your condition.  Will get help right away if you are not doing well or get worse. Document Released: 06/15/2000 Document Revised: 11/02/2013 Document Reviewed: 09/04/2010 [facility] Patient Information 2015 [place], [place]. This information is not intended to replace advice given to you by your health care provider. Make sure you discuss any questions you have with your health care provider.  Repetitive Strain Injuries Repetitive strain injuries (RSIs) result from overuse or misuse of soft tissues including muscles, tendons, or nerves. Tendons are the cord-like structures that attach muscles to bones.  RSIs can affect almost any part of the body. However, RSIs are most common in the arms (thumbs, wrists, elbows, shoulders) and legs (ankles, knees). Common medical conditions that are often caused by repetitive strain include carpal tunnel syndrome, tennis or golfer's elbow, bursitis, and tendonitis. If RSIs are treated early, and therepeated activity is reduced or removed, the severity and length of your problems can usually be reduced. RSIs are also called cumulative trauma disorders (CTD).  CAUSES  Many RSIs occur due to repeating the same activity at work over weeks or months without sufficient rest, such as prolonged typing. RSIs also commonly occur when a hobby or sport is done repeatedly without sufficient rest. RSIs can also occur due to repeated strain or stress on a body part in someone who has one or more risk factors for RSIs. RISK FACTORS Workplace risk factors  Frequent computer use, especially if your workstation is not adjusted for your body type.  Infrequent rest breaks.  Working in a high-pressure environment.  Working at a [employer].  Repeating the same motion, such as frequent typing.  Working in an awkward position or holding the same position for a long time.  Forceful movements such as lifting, pulling, or pushing.  Vibration caused by using power tools.  Working in cold temperatures.  Job stress. Personal risk factors  Poor posture.  Being loose-jointed.  Not exercising regularly.  Being overweight.  Arthritis, diabetes, thyroid problems, or other long-term (chronic)medical conditions.  Vitamin deficiencies.  Keeping your fingernails long.  An unhealthy, stressful, or inactive lifestyle.  Not sleeping well. SYMPTOMS  Symptoms often begin at work but become more noticeable after the repeated stress has ended. For example, you may develop fatigue or soreness in your wrist while typingat work, and at night you may develop numbness and tingling in  your fingers. Common symptoms include:   Burning, shooting, or aching pain, especially in the fingers, palms, wrists, forearms, or shoulders.  Tenderness.  Swelling.  Tingling, numbness, or loss of feeling.  Pain with certain activities, such as turning a doorknob or reaching above your head.  Weakness, heaviness, or loss of coordination in yourhand.  Muscle spasms or tightness. In some cases, symptoms can become so intense that it is difficult to perform everyday tasks. Symptoms that do not improve with rest may indicate a more serious condition.  DIAGNOSIS  Your caregiver may determine the type ofRSI you have based on your medical evaluation and a description of your activities.  TREATMENT  Treatment depends on the severity and type of RSI you have. Your caregiver may recommend rest for the affected body part, medicines, and physical or occupational therapy to reduce pain, swelling, and soreness. Discuss the activities you do repeatedly with your caregiver. Your caregiver can help you decide whether you need to change your activities. An RSI may take months or years to heal, especially if the affected body part gets insufficient rest. In some cases, such as severe carpal tunnel syndrome, surgery may be recommended. PREVENTION  Talk with your supervisor to make sure you have the proper equipment for your work station.  Maintain good posture at your desk or work station with:  Feet flat on the floor.  Knees directly over the feet, bent at a right angle.  Lower back supported by your chair or a cushion in the curve of your lower back.  Shoulders and arms relaxed and at your sides.  Neck relaxed and not bent forwards or backwards.  Your desk and computer workstation properly adjusted to your body type.  Your chair adjusted so there is no excess pressure on the back of your thighs.  The keyboard resting above your thighs. You should be able to reach the keys with your elbows at  your side, bent at a right angle. Your arms should be supported on forearm rests, with your forearms parallel to the ground.  The computer mouse within easy reach.  The monitor directly in front of you, so that your eyes are aligned with the top of the screen. The screen should be about 15 to 25 inches from your eyes.  While typing, keep your wrist straight, in a neutral position. Move your entire arm when you move your mouse or when typing hard-to-reach keys.  Only use your computer as much as you need to for work. Do not use it during breaks.  Take breaks often from any repeated activity. Alternate with another task which requires you to use different muscles, or rest at least once every hour.  Change positions regularly. If you spend a lot of time sitting, get up, walk around, and stretch.  Do not hold pens or pencils tightly when writing.  Exercise regularly.  Maintain a normal weight.  Eat a diet with plenty of vegetables, whole grains, and fruit.  Get sufficient, restful sleep. HOME CARE INSTRUCTIONS  If  your caregiver prescribed medicine to help reduce swelling, take it as directed.  Only take over-the-counter or prescription medicines for pain, discomfort, or fever as directed by your caregiver.  Reduce, and if needed, stopthe activities that are causing your problems until you have no further symptoms.If your symptoms are work-related, you may need to talk to your supervisor about changing your activities.  When symptoms develop, put ice or a cold pack on the aching area.  Put ice in a plastic bag.  Place a towel between your skin and the bag.  Leave the ice on for 15-20 minutes.  If you were given a splint to keep your wrist from bending, wear it as instructed. It is important to wear the splint at night. Use the splint for as long as your caregiver recommends. SEEK MEDICAL CARE IF:  You develop new problems.  Your problems do not get better with  medicine. MAKE SURE YOU:  Understand these instructions.  Will watch your condition.  Will get help right away if you are not doing well or get worse. Document Released: 06/08/2002 Document Revised: 12/18/2011 Document Reviewed: 08/09/2011 [facility] Patient Information 2015 [place], [place]. This information is not intended to replace advice given to you by your health care provider. Make sure you discuss any questions you have with your health care provider.

## 2014-09-21 NOTE — ED Provider Notes (Signed)
CSN: [id number]     Arrival date & time 09/21/14  1143 History  This chart was scribed for non-physician practitioner, Allen DerryMercedes Camprubi-Soms, PA-C working with Elwin MochaBlair Walden, MD by Luisa DagoPriscilla Tutu, Medical Scribe. This patient was seen in room WTR5/WTR5 and the patient's care was started at 12:24 PM.    Chief Complaint  Patient presents with  . Ankle Pain   Patient is a 39 y.o. female presenting with ankle pain. The history is provided by the patient and medical records. No language interpreter was used.  Ankle Pain Location:  Foot (left foot/heel) Time since incident:  1 day Injury: no   Foot location:  L foot and sole of L foot Pain details:    Quality:  Throbbing   Radiates to:  Does not radiate   Severity:  Moderate   Onset quality:  Sudden   Duration:  1 day   Timing:  Constant   Progression:  Worsening Chronicity:  New Dislocation: no   Foreign body present:  No foreign bodies Prior injury to area:  No Relieved by:  Nothing Worsened by:  Bearing weight Ineffective treatments: epsom salt baths. Associated symptoms: no decreased ROM, no muscle weakness, no numbness, no stiffness, no swelling and no tingling    HPI Comments: Marie Kramer is a 39 y.o. female who presents to the Emergency Department complaining of left heel/foot pain that started yesterday while at work. Pt describes her pain as constant, throbbing, and non-radiating. Current pain is rated as a "8/10". Pt states that the pain is exacerbated by long periods of standing, which she does while at work. She denies any recent injuries or falls. Pt denies fever, neck pain, sore throat, visual disturbance, CP, cough, SOB, abdominal pain, tingling, nausea, emesis, diarrhea, urinary symptoms, back pain, HA, weakness, numbness and rash as associated symptoms.     Past Medical History  Diagnosis Date  . Asthma    Past Surgical History  Procedure Laterality Date  . Tubal ligation     History reviewed. No pertinent  family history. History  Substance Use Topics  . Smoking status: Never Smoker   . Smokeless tobacco: Not on file  . Alcohol Use: No   OB History    No data available     Review of Systems  Musculoskeletal: Positive for arthralgias (L foot). Negative for myalgias, joint swelling and stiffness.  Skin: Negative for color change.  Allergic/Immunologic: Negative for immunocompromised state.  Neurological: Negative for weakness and numbness.       No tingling  10 Systems reviewed and all are negative for acute change except as noted in the HPI.  Allergies  Review of patient's allergies indicates no known allergies.  Home Medications   Prior to Admission medications   Medication Sig Start Date End Date Taking? Authorizing Provider  azithromycin (ZITHROMAX) 250 MG tablet Take 1 tablet (250 mg total) by mouth daily. 09/06/13   Earley FavorGail Schulz, NP  chlorpheniramine-HYDROcodone [facility](TUSSIONEX PENNKINETIC ER) 10-8 MG/5ML LQCR Take 5 mLs by mouth every 12 (twelve) hours as needed for cough. 09/06/13   Earley FavorGail Schulz, NP  dextromethorphan (DELSYM) 30 MG/5ML liquid Take 15 mg by mouth as needed for cough.    Historical Provider, MD   BP 112/68 mmHg  Pulse 75  Temp(Src) 97.9 F (36.6 C) (Oral)  Resp 16  SpO2 100%  LMP 08/21/2014  Physical Exam  Constitutional: She is oriented to person, place, and time. Vital signs are normal. She appears well-developed and well-nourished.  Non-toxic appearance.  No distress.  Afebrile, nontoxic, NAD  HENT:  Head: Normocephalic and atraumatic.  Mouth/Throat: Mucous membranes are normal.  Eyes: Conjunctivae and EOM are normal. Right eye exhibits no discharge. Left eye exhibits no discharge.  Neck: Normal range of motion. Neck supple.  Cardiovascular: Normal rate and intact distal pulses.   Pulmonary/Chest: Effort normal. No respiratory distress.  Abdominal: Normal appearance. She exhibits no distension.  Musculoskeletal: Normal range of motion.       Left foot:  There is tenderness. There is normal range of motion, no bony tenderness, no swelling, normal capillary refill and no deformity.       Feet:  FROM intact in L ankle with no bony TTP, mild tenderness over achilles tendon insertion and plantar fascia insertion sites, no overlying skin changes, no swelling. Strength and sensation intact, distal pulses intact. Wiggling toes without difficulty.   Neurological: She is alert and oriented to person, place, and time. She has normal strength. No sensory deficit.  Skin: Skin is warm, dry and intact. No rash noted.  Psychiatric: She has a normal mood and affect. Her behavior is normal.  Nursing note and vitals reviewed.   ED Course  Procedures (including critical care time)  DIAGNOSTIC STUDIES: Oxygen Saturation is 100% on RA, normal by my interpretation.    COORDINATION OF CARE: 12:25 PM-Pt advised to not wear high heel shoes or any kind of shoes that will exacerbate her pain. Also will prescribe Naproxen twice a day for the next week with food. Will also prescribe Narco to take PRN. Discussed therapeutic methods to alleviate the pain. Will also give the pt a referral to podiatry if the pain does not resolve in a week or two. Pt advised of plan for treatment and pt agrees.  Labs Review Labs Reviewed - No data to display  Imaging Review No results found.   EKG Interpretation None      MDM   Final diagnoses:  Plantar fasciitis of left foot  Achilles tendinitis of left lower extremity    39 y.o. female here with L plantar fascia pain and achilles tendinitis after long periods of standing on her feet. Tenderness at insertion site of achilles tendon and plantar fascia. No bony TTP. FROM intact. Neurovascularly intact with soft compartments. Achilles tendon intact. Likely tendinitis. Discussed ice, water bottle stretches, naprosyn, and norco PRN for severe pain. Will have her f/up with [facility] as needed for ongoing management. Doubt  gout or traumatic injury requiring imaging. I explained the diagnosis and have given explicit precautions to return to the ER including for any other new or worsening symptoms. The patient understands and accepts the medical plan as it's been dictated and I have answered their questions. Discharge instructions concerning home care and prescriptions have been given. The patient is STABLE and is discharged to home in good condition.   I personally performed the services described in this documentation, which was scribed in my presence. The recorded information has been reviewed and is accurate.  BP 112/68 mmHg  Pulse 75  Temp(Src) 97.9 F (36.6 C) (Oral)  Resp 16  SpO2 100%  LMP 08/21/2014  Meds ordered this encounter  Medications  . naproxen (NAPROSYN) tablet 500 mg    Sig:   . naproxen (NAPROSYN) 500 MG tablet    Sig: Take 1 tablet (500 mg total) by mouth 2 (two) times daily with a meal. X 7 days. Then take 1 tablet PO BID WC PRN pain    Dispense:  20 tablet    Refill:  0    Order Specific Question:  Supervising Provider    Answer:  MILLER, BRIAN [[id number]]  . HYDROcodone-acetaminophen (NORCO) 5-325 MG per tablet    Sig: Take 1 tablet by mouth every 6 (six) hours as needed for severe pain.    Dispense:  6 tablet    Refill:  0    Order Specific Question:  Supervising Provider    Answer:  Eber Hong [[id number]]     Marie Gladson Camprubi-Soms, PA-C 09/21/14 1244  Elwin Mocha, MD 09/21/14 1517

## 2014-09-21 NOTE — ED Notes (Signed)
Pt states left ankle/heel pain since yesterday.  No trauma. Pt stands long periods at a time.  No hx of same.

## 2014-11-17 ENCOUNTER — Other Ambulatory Visit: Payer: Self-pay | Admitting: Obstetrics and Gynecology

## 2014-11-17 DIAGNOSIS — R928 Other abnormal and inconclusive findings on diagnostic imaging of breast: Secondary | ICD-10-CM

## 2014-11-22 ENCOUNTER — Ambulatory Visit
Admission: RE | Admit: 2014-11-22 | Discharge: 2014-11-22 | Disposition: A | Discharge status: Home / Self Care | Payer: Federal, State, Local not specified - PPO | Source: Ambulatory Visit | Attending: Obstetrics and Gynecology | Admitting: Obstetrics and Gynecology

## 2014-11-22 DIAGNOSIS — R928 Other abnormal and inconclusive findings on diagnostic imaging of breast: Secondary | ICD-10-CM

## 2014-11-25 ENCOUNTER — Emergency Department (HOSPITAL_COMMUNITY)
Admission: EM | Admit: 2014-11-25 | Discharge: 2014-11-25 | Disposition: A | Discharge status: Home / Self Care | Payer: Federal, State, Local not specified - PPO | Attending: Emergency Medicine | Admitting: Emergency Medicine

## 2014-11-25 ENCOUNTER — Encounter (HOSPITAL_COMMUNITY): Payer: Self-pay | Admitting: Emergency Medicine

## 2014-11-25 DIAGNOSIS — R509 Fever, unspecified: Secondary | ICD-10-CM | POA: Diagnosis present

## 2014-11-25 DIAGNOSIS — J45901 Unspecified asthma with (acute) exacerbation: Secondary | ICD-10-CM | POA: Diagnosis not present

## 2014-11-25 DIAGNOSIS — J029 Acute pharyngitis, unspecified: Secondary | ICD-10-CM

## 2014-11-25 DIAGNOSIS — J40 Bronchitis, not specified as acute or chronic: Secondary | ICD-10-CM

## 2014-11-25 LAB — RAPID STREP SCREEN (MED CTR MEBANE ONLY): STREPTOCOCCUS, GROUP A SCREEN (DIRECT): NEGATIVE

## 2014-11-25 MED ORDER — AZITHROMYCIN 250 MG PO TABS: ORAL_TABLET | ORAL | Status: DC

## 2014-11-25 MED ORDER — GUAIFENESIN-CODEINE 100-10 MG/5ML PO SYRP: 5.0000 mL | ORAL_SOLUTION | Freq: Three times a day (TID) | ORAL | Status: DC | PRN

## 2014-11-25 NOTE — ED Provider Notes (Signed)
CSN: [id number]     Arrival date & time 11/25/14  0457 History   First MD Initiated Contact with Patient 11/25/14 [id number]     Chief Complaint  Patient presents with  . Sore Throat  . Fever     (Consider location/radiation/quality/duration/timing/severity/associated sxs/prior Treatment) Patient is a 39 y.o. female presenting with pharyngitis and fever. The history is provided by the patient.  Sore Throat This is a new problem. The current episode started yesterday. The problem occurs constantly. The problem has been gradually worsening. Associated symptoms include chills, congestion, coughing, a fever, headaches, myalgias and a sore throat. Pertinent negatives include no abdominal pain, chest pain, nausea, neck pain, rash or vomiting. The symptoms are aggravated by swallowing and coughing. She has tried acetaminophen for the symptoms. The treatment provided mild relief.  Fever Associated symptoms: chills, congestion, cough, headaches, myalgias and sore throat   Associated symptoms: no chest pain, no confusion, no diarrhea, no dysuria, no ear pain, no nausea, no rash and no vomiting    Marie Kramer is a [age] y.o. female with hx of asthma presents to the ED with sore throat and fever that started yesterday. She reports being around sick family member recently. She has taken OTC medications without relief.   Past Medical History  Diagnosis Date  . Asthma    Past Surgical History  Procedure Laterality Date  . Tubal ligation     History reviewed. No pertinent family history. History  Substance Use Topics  . Smoking status: Never Smoker   . Smokeless tobacco: Not on file  . Alcohol Use: No   OB History    No data available     Review of Systems  Constitutional: Positive for fever and chills.  HENT: Positive for congestion, sinus pressure and sore throat. Negative for ear pain.   Respiratory: Positive for cough and wheezing. Chest tightness: with cough.   Cardiovascular: Negative  for chest pain.  Gastrointestinal: Negative for nausea, vomiting, abdominal pain, diarrhea and constipation.  Genitourinary: Negative for dysuria, urgency, frequency, vaginal bleeding and vaginal discharge.  Musculoskeletal: Positive for myalgias. Negative for neck pain.  Skin: Negative for rash.  Neurological: Positive for headaches.  Psychiatric/Behavioral: Negative for confusion. The patient is not nervous/anxious.       Allergies  Review of patient's allergies indicates no known allergies.  Home Medications   Prior to Admission medications   Medication Sig Start Date End Date Taking? Authorizing Provider  azithromycin (ZITHROMAX Z-PAK) 250 MG tablet Take 2 tablets PO today and then one tablet daily 11/25/14   [facility] Orlene OchM Depaul Arizpe, NP  guaiFENesin-codeine (ROBITUSSIN AC) 100-10 MG/5ML syrup Take 5 mLs by mouth 3 (three) times daily as needed for cough. 11/25/14   Reyhan Moronta Orlene OchM Loxley Cibrian, NP  HYDROcodone-acetaminophen (NORCO) 5-325 MG per tablet Take 1 tablet by mouth every 6 (six) hours as needed for severe pain. Patient not taking: Reported on 11/25/2014 09/21/14   Mercedes Camprubi-Soms, PA-C  naproxen (NAPROSYN) 500 MG tablet Take 1 tablet (500 mg total) by mouth 2 (two) times daily with a meal. X 7 days. Then take 1 tablet PO BID WC PRN pain Patient not taking: Reported on 11/25/2014 09/21/14   Mercedes Camprubi-Soms, PA-C   BP 115/80 mmHg  Pulse 79  Temp(Src) 98.7 F (37.1 C) (Oral)  Resp 16  Ht 5\' 3"  (1.6 m)  Wt 190 lb (86.183 kg)  BMI 33.67 kg/m2  SpO2 100%  LMP 11/14/2014 (Exact Date) Physical Exam  Constitutional: She is oriented to person,  place, and time. She appears well-developed and well-nourished.  HENT:  Head: Normocephalic.  Nose: Right sinus exhibits maxillary sinus tenderness. Left sinus exhibits maxillary sinus tenderness.  Mouth/Throat: Uvula is midline and mucous membranes are normal. Posterior oropharyngeal erythema present.  Unable to visualize TM due to cerumen bilateral.    Eyes: Conjunctivae and EOM are normal. Pupils are equal, round, and reactive to light.  Neck: Normal range of motion. Neck supple.  Cardiovascular: Normal rate and regular rhythm.   Pulmonary/Chest: Effort normal.  Abdominal: Soft. There is no tenderness.  Musculoskeletal: Normal range of motion.  Lymphadenopathy:    She has no cervical adenopathy.  Neurological: She is alert and oriented to person, place, and time. No cranial nerve deficit.  Skin: Skin is warm and dry.  Psychiatric: She has a normal mood and affect. Her behavior is normal.  Nursing note and vitals reviewed.   ED Course  Procedures (including critical care time) Labs Review Results for orders placed or performed during the hospital encounter of 11/25/14 (from the past 24 hour(s))  Rapid strep screen   (If patient has fever and/or without cough or runny nose)     Status: None   Collection Time: 11/25/14  5:12 AM  Result Value Ref Range   Streptococcus, Group A Screen (Direct) NEGATIVE NEGATIVE    Imaging  MDM  39 y.o. female with sore throat cough and fever and sinus pressure that started yesterday and is worse today. Stable for d/c without respiratory distress. O2 SAT 100% on R/A. Discussed with the patient clinical and lab findings and plan of care and all questioned fully answered. She will return if any problems arise.   Final diagnoses:  Bronchitis  Sore throat        [place], NP 11/25/14 0454  Derwood Kaplan, MD 11/27/14 [phone]

## 2014-11-25 NOTE — Discharge Instructions (Signed)
Take the medication as directed. The cough medication may make you sleepy. Return as needed for worsening symptoms.

## 2014-11-25 NOTE — ED Notes (Signed)
Pt states yesterday she had a sore throat and it hurt to eat or drink and this morning she states she has a headache and a low grade fever

## 2014-11-27 LAB — CULTURE, GROUP A STREP: STREP A CULTURE: NEGATIVE

## 2015-01-24 ENCOUNTER — Encounter (HOSPITAL_COMMUNITY): Payer: Self-pay

## 2015-01-24 ENCOUNTER — Emergency Department (HOSPITAL_COMMUNITY): Payer: Federal, State, Local not specified - PPO

## 2015-01-24 ENCOUNTER — Emergency Department (HOSPITAL_COMMUNITY)
Admission: EM | Admit: 2015-01-24 | Discharge: 2015-01-24 | Disposition: A | Discharge status: Home / Self Care | Payer: Federal, State, Local not specified - PPO | Attending: Emergency Medicine | Admitting: Emergency Medicine

## 2015-01-24 DIAGNOSIS — M654 Radial styloid tenosynovitis [de Quervain]: Secondary | ICD-10-CM | POA: Insufficient documentation

## 2015-01-24 DIAGNOSIS — J45909 Unspecified asthma, uncomplicated: Secondary | ICD-10-CM | POA: Insufficient documentation

## 2015-01-24 DIAGNOSIS — M7989 Other specified soft tissue disorders: Secondary | ICD-10-CM | POA: Diagnosis present

## 2015-01-24 MED ORDER — IBUPROFEN 800 MG PO TABS
800.0000 mg | ORAL_TABLET | Freq: Once | ORAL | Status: AC
  Administered 2015-01-24: 800 mg via ORAL
  Filled 2015-01-24: qty 1

## 2015-01-24 MED ORDER — NAPROXEN 250 MG PO TABS: 250.0000 mg | ORAL_TABLET | Freq: Two times a day (BID) | ORAL | Status: DC

## 2015-01-24 NOTE — Discharge Instructions (Signed)
De Quervain's Disease Suzette Battiest disease is a condition often seen in racquet sports where there is a soreness (inflammation) in the cord like structures (tendons) which attach muscle to bone on the thumb side of the wrist. There may be a tightening of the tissuesaround the tendons. This condition is often helped by giving up or modifying the activity which caused it. When conservative treatment does not help, surgery may be required. Conservative treatment could include changes in the activity which brought about the problem or made it worse. Anti-inflammatory medications and injections may be used to help decrease the inflammation and help with pain control. Your caregiver will help you determine which is best for you. DIAGNOSIS  Often the diagnosis (learning what is wrong) can be made by examination. Sometimes x-rays are required. HOME CARE INSTRUCTIONS   Apply ice to the sore area for 15-20 minutes, 03-04 times per day while awake. Put the ice in a plastic bag and place a towel between the bag of ice and your skin. This is especially helpful if it can be done after all activities involving the sore wrist.  Temporary splinting may help.  Only take over-the-counter or prescription medicines for pain, discomfort or fever as directed by your caregiver. SEEK MEDICAL CARE IF:   Pain relief is not obtained with medications, or if you have increasing pain and seem to be getting worse rather than better. MAKE SURE YOU:   Understand these instructions.  Will watch your condition.  Will get help right away if you are not doing well or get worse. Document Released: 03/13/2001 Document Revised: 09/10/2011 Document Reviewed: 10/21/2013 [facility] Patient Information 2015 [place], [place]. This information is not intended to replace advice given to you by your health care provider. Make sure you discuss any questions you have with your health care provider. Wrist Splint A wrist splint is a brace that  holds your wrist in a fixed position. It can be used to stabilize your wrist so that broken bones and sprains can heal faster, with less pain. It can also help to relieve pressure on the nerve that runs down the middle of your arm (median nerve). Splints are available in drugstores without a prescription. They are also available by prescription from orthopedic and medical supply stores. Custom splints made from lightweight materials can be made by physical or occupational therapist. HOME CARE INSTRUCTIONS Wear your splint as instructed by your caregiver. It may be worn while you sleep. Your caregiver may instruct you how to perform certain exercises at home. These exercises help maintain muscle strength in your hand and wrist. They also help to maintain motion in your fingers. SEEK MEDICAL CARE IF: You start to lose feeling in your hand or fingers. Your skin or fingernails turn blue or gray, or they feel cold. MAKE SURE YOU:  Understand these instructions. Will watch your condition. Will get help right away if you are not doing well or get worse. Document Released: 05/31/2006 Document Revised: 09/10/2011 Document Reviewed: 09/29/2013 [facility] Patient Information 2015 [place], [place]. This information is not intended to replace advice given to you by your health care provider. Make sure you discuss any questions you have with your health care provider.

## 2015-01-24 NOTE — ED Provider Notes (Signed)
CSN: [id number]     Arrival date & time 01/24/15  1350 History  This chart was scribed for non-physician practitioner, Everlene Farrier, PA-C, working with Linwood Dibbles, MD by Charline Bills, ED Scribe. This patient was seen in room WTR7/WTR7 and the patient's care was started at 2:34 PM.   Chief Complaint  Patient presents with  . Arm Swelling   The history is provided by the patient. No language interpreter was used.   HPI Comments: Marie Kramer is a 39 y.o. female who presents to the Emergency Department complaining of gradually worsening, constant left forearm pain that radiates into left thumb onset 2 days ago. Pt reports a lot of heavy lifting at work and repetitive use of her hands. She denies fall or injury. Pain is exacerbated with bending her left thumb. She also reports associated swelling to her left forearm yesterday. No treatments tried PTA. Pt denies numbness, neck pain, back pain, left shoulder pain, left elbow pain, fevers, chills or trauma.  Past Medical History  Diagnosis Date  . Asthma    Past Surgical History  Procedure Laterality Date  . Tubal ligation     History reviewed. No pertinent family history. History  Substance Use Topics  . Smoking status: Never Smoker   . Smokeless tobacco: Not on file  . Alcohol Use: No   OB History    No data available     Review of Systems  Constitutional: Negative for fever.  Musculoskeletal: Positive for joint swelling and arthralgias. Negative for back pain and neck pain.  Skin: Negative for rash and wound.  Neurological: Negative for weakness and numbness.   Allergies  Review of patient's allergies indicates no known allergies.  Home Medications   Prior to Admission medications   Medication Sig Start Date End Date Taking? Authorizing Provider  azithromycin (ZITHROMAX Z-PAK) 250 MG tablet Take 2 tablets PO today and then one tablet daily Patient not taking: Reported on 01/24/2015 11/25/14   Janne Napoleon, NP   guaiFENesin-codeine (ROBITUSSIN AC) 100-10 MG/5ML syrup Take 5 mLs by mouth 3 (three) times daily as needed for cough. Patient not taking: Reported on 01/24/2015 11/25/14   Janne Napoleon, NP  HYDROcodone-acetaminophen (NORCO) 5-325 MG per tablet Take 1 tablet by mouth every 6 (six) hours as needed for severe pain. Patient not taking: Reported on 11/25/2014 09/21/14   Mercedes Camprubi-Soms, PA-C  naproxen (NAPROSYN) 250 MG tablet Take 1 tablet (250 mg total) by mouth 2 (two) times daily with a meal. 01/24/15   Everlene Farrier, PA-C   BP 100/58 mmHg  Pulse 62  Temp(Src) 98.9 F (37.2 C) (Oral)  Resp 18  SpO2 98%  LMP 01/10/2015 (Approximate) Physical Exam  Constitutional: She appears well-developed and well-nourished. No distress.  nontoxic appearing.  HENT:  Head: Normocephalic and atraumatic.  Eyes: Right eye exhibits no discharge. Left eye exhibits no discharge.  Cardiovascular: Normal rate and intact distal pulses.   Bilateral radial pulses are intact. Capillary refill is normal and her bilateral distal digits.  Pulmonary/Chest: Effort normal. No respiratory distress.  Musculoskeletal: She exhibits tenderness. She exhibits no edema.  Positive finkelstein test on left- pain with thumb flexion. Tenderness at L radial styloid. No erythema or warmth. No soft tissue swelling. No deformity. Good wrist ROM. Good cap refill. Compartments are soft.   Neurological: She is alert. No cranial nerve deficit or sensory deficit. Coordination normal.  L radial, medial and ulnar nerves intact. Sensation intact to her bilalteral hands.  Skin: Skin is warm and dry. No rash noted. She is not diaphoretic. No erythema. No pallor.  Psychiatric: She has a normal mood and affect. Her behavior is normal.  Nursing note and vitals reviewed.  ED Course  Procedures (including critical care time) DIAGNOSTIC STUDIES: Oxygen Saturation is 98% on RA, normal by my interpretation.    COORDINATION OF CARE: 2:38  PM-Discussed treatment plan which includes XR, 800 mg ibuprofen and splint with pt at bedside and pt agreed to plan.   Labs Review Labs Reviewed - No data to display  Imaging Review Dg Wrist Complete Left  01/24/2015   CLINICAL DATA:  Left wrist pain for 2 days.  No injury.  EXAM: LEFT WRIST - COMPLETE 3+ VIEW  COMPARISON:  None.  FINDINGS: No fracture or dislocation.  Unremarkable soft tissues.  IMPRESSION: No acute bony pathology.   Electronically Signed   By: Jolaine Click M.D.   On: 01/24/2015 15:30    EKG Interpretation None      Filed Vitals:   01/24/15 1421  BP: 100/58  Pulse: 62  Temp: 98.9 F (37.2 C)  TempSrc: Oral  Resp: 18  SpO2: 98%     MDM   Meds given in ED:  Medications  ibuprofen (ADVIL,MOTRIN) tablet 800 mg (800 mg Oral Given 01/24/15 1510)    New Prescriptions   NAPROXEN (NAPROSYN) 250 MG TABLET    Take 1 tablet (250 mg total) by mouth 2 (two) times daily with a meal.    Final diagnoses:  De Quervain's tenosynovitis   This is a 39 y.o. female who presents to the Emergency Department complaining of gradually worsening, constant left forearm pain that radiates into left thumb onset 2 days ago. Pt reports a lot of heavy lifting at work and repetitive use of her hands. She denies fall or injury. On exam the patient is afebrile and nontoxic appearing. Patient has no soft tissue swelling noted. Patient has pain with flexion of her left thumb- positive finkelstein test. She also has tenderness over her left radial styloid. No deformity noted. No erythema or warmth noted. X-ray of her left wrist is unremarkable. Patient with de Quervain's tenosynovitis.Will place the patient in a thumb spica splint and provided with prescription for Naprosyn. Patient given follow-up with hand surgery and with her primary care provider. I advised the patient to follow-up with their primary care provider this week. I advised the patient to return to the emergency department with new or  worsening symptoms or new concerns. The patient verbalized understanding and agreement with plan.    This patient was discussed with Dr. Lynelle Doctor who agrees with assessment and plan.   I personally performed the services described in this documentation, which was scribed in my presence. The recorded information has been reviewed and is accurate.      Everlene Farrier, PA-C 01/24/15 1550  Linwood Dibbles, MD 01/24/15 [phone]

## 2015-01-24 NOTE — ED Notes (Addendum)
Pt c/o L hand/forearm swelling x 2 days.  Pain score 5/10.  Pt has not taken anything for pain/swelling.  Pt reports that she lifts a lot at work and noticed swelling at work.  Denies injury.  Limited L thumb and wrist ROM.

## 2015-04-20 ENCOUNTER — Other Ambulatory Visit: Payer: Self-pay | Admitting: Obstetrics and Gynecology

## 2015-04-20 DIAGNOSIS — R921 Mammographic calcification found on diagnostic imaging of breast: Secondary | ICD-10-CM

## 2015-04-26 ENCOUNTER — Ambulatory Visit
Admission: RE | Admit: 2015-04-26 | Discharge: 2015-04-26 | Disposition: A | Discharge status: Home / Self Care | Payer: Federal, State, Local not specified - PPO | Source: Ambulatory Visit | Attending: Obstetrics and Gynecology | Admitting: Obstetrics and Gynecology

## 2015-04-26 DIAGNOSIS — R921 Mammographic calcification found on diagnostic imaging of breast: Secondary | ICD-10-CM

## 2015-06-12 ENCOUNTER — Encounter (HOSPITAL_COMMUNITY): Payer: Self-pay | Admitting: *Deleted

## 2015-06-12 ENCOUNTER — Emergency Department (HOSPITAL_COMMUNITY)
Admission: EM | Admit: 2015-06-12 | Discharge: 2015-06-12 | Disposition: A | Discharge status: Home / Self Care | Payer: Federal, State, Local not specified - PPO | Attending: Emergency Medicine | Admitting: Emergency Medicine

## 2015-06-12 DIAGNOSIS — Z791 Long term (current) use of non-steroidal anti-inflammatories (NSAID): Secondary | ICD-10-CM | POA: Diagnosis not present

## 2015-06-12 DIAGNOSIS — J45909 Unspecified asthma, uncomplicated: Secondary | ICD-10-CM | POA: Insufficient documentation

## 2015-06-12 DIAGNOSIS — J069 Acute upper respiratory infection, unspecified: Secondary | ICD-10-CM

## 2015-06-12 DIAGNOSIS — R05 Cough: Secondary | ICD-10-CM | POA: Diagnosis present

## 2015-06-12 MED ORDER — GUAIFENESIN-CODEINE 100-10 MG/5ML PO SYRP: 5.0000 mL | ORAL_SOLUTION | Freq: Three times a day (TID) | ORAL | Status: DC | PRN

## 2015-06-12 MED ORDER — FLUTICASONE PROPIONATE 50 MCG/ACT NA SUSP: 1.0000 | Freq: Every day | NASAL | Status: DC

## 2015-06-12 MED ORDER — BENZONATATE 100 MG PO CAPS: 100.0000 mg | ORAL_CAPSULE | Freq: Three times a day (TID) | ORAL | Status: DC

## 2015-06-12 MED ORDER — IBUPROFEN 800 MG PO TABS
800.0000 mg | ORAL_TABLET | Freq: Three times a day (TID) | ORAL | Status: DC
Start: 2015-06-12 — End: 2015-06-29

## 2015-06-12 NOTE — ED Provider Notes (Signed)
CSN: [id number]     Arrival date & time 06/12/15  0020 History  By signing my name below, I, Bethel BornBritney McCollum, attest that this documentation has been prepared under the direction and in the presence of Gilda Creasehristopher J Pollina, MD. Electronically Signed: Bethel BornBritney McCollum, ED Scribe. 06/12/2015. 12:34 AM   Chief Complaint  Patient presents with  . Generalized Body Aches    The history is provided by the patient. No language interpreter was used.   Marie Kramer is a 39 y.o. female who presents to the Emergency Department complaining of new, 7/10 in severity, aching, generalized myalgias with onset yesterday. Associated symptoms include headache, sore throat, and cough. Pt denies fever, ear pain, vomiting, and diarrhea.   Past Medical History  Diagnosis Date  . Asthma    Past Surgical History  Procedure Laterality Date  . Tubal ligation     No family history on file. Social History  Substance Use Topics  . Smoking status: Never Smoker   . Smokeless tobacco: None  . Alcohol Use: No   OB History    No data available     Review of Systems  Constitutional: Negative for fever.  HENT: Positive for sore throat. Negative for ear pain.   Respiratory: Positive for cough. Negative for shortness of breath.   Cardiovascular: Negative for chest pain.  Gastrointestinal: Negative for nausea and vomiting.  Musculoskeletal: Positive for myalgias.  Neurological: Positive for headaches.    Allergies  Review of patient's allergies indicates no known allergies.  Home Medications   Prior to Admission medications   Medication Sig Start Date End Date Taking? Authorizing Provider  azithromycin (ZITHROMAX Z-PAK) 250 MG tablet Take 2 tablets PO today and then one tablet daily Patient not taking: Reported on 01/24/2015 11/25/14   Janne NapoleonHope M Neese, NP  guaiFENesin-codeine (ROBITUSSIN AC) 100-10 MG/5ML syrup Take 5 mLs by mouth 3 (three) times daily as needed for cough. Patient not taking: Reported on  01/24/2015 11/25/14   Janne NapoleonHope M Neese, NP  HYDROcodone-acetaminophen (NORCO) 5-325 MG per tablet Take 1 tablet by mouth every 6 (six) hours as needed for severe pain. Patient not taking: Reported on 11/25/2014 09/21/14   Mercedes Camprubi-Soms, PA-C  naproxen (NAPROSYN) 250 MG tablet Take 1 tablet (250 mg total) by mouth 2 (two) times daily with a meal. 01/24/15   Everlene FarrierWilliam Dansie, PA-C   BP 128/72 mmHg  Pulse 74  Temp(Src) 97.9 F (36.6 C) (Oral)  Resp 18  SpO2 100%  LMP 05/23/2015 Physical Exam  Constitutional: She is oriented to person, place, and time. She appears well-developed and well-nourished. No distress.  HENT:  Head: Normocephalic and atraumatic.  Right Ear: Hearing normal.  Left Ear: Hearing normal.  Nose: Nose normal.  Mouth/Throat: Oropharynx is clear and moist and mucous membranes are normal.  Eyes: Conjunctivae and EOM are normal. Pupils are equal, round, and reactive to light.  Neck: Normal range of motion. Neck supple.  Cardiovascular: Regular rhythm, S1 normal and S2 normal.  Exam reveals no gallop and no friction rub.   No murmur heard. Pulmonary/Chest: Effort normal and breath sounds normal. No respiratory distress. She exhibits no tenderness.  Abdominal: Soft. Normal appearance and bowel sounds are normal. There is no hepatosplenomegaly. There is no tenderness. There is no rebound, no guarding, no tenderness at McBurney's point and negative Murphy's sign. No hernia.  Musculoskeletal: Normal range of motion.  Neurological: She is alert and oriented to person, place, and time. She has normal strength. No cranial nerve  deficit or sensory deficit. Coordination normal. GCS eye subscore is 4. GCS verbal subscore is 5. GCS motor subscore is 6.  Skin: Skin is warm, dry and intact. No rash noted. No cyanosis.  Psychiatric: She has a normal mood and affect. Her speech is normal and behavior is normal. Thought content normal.  Nursing note and vitals reviewed.   ED Course   Procedures (including critical care time)  DIAGNOSTIC STUDIES: Oxygen Saturation is 100% on RA,  normal by my interpretation.    COORDINATION OF CARE: 12:31 AM Discussed treatment plan which includes discharge with symptomatic treatment with pt at bedside and pt agreed to plan.  Labs Review Labs Reviewed - No data to display  Imaging Review No results found.    EKG Interpretation None      MDM   Final diagnoses:  URI (upper respiratory infection)    Patient presents with upper respiratory infection symptoms. She reports generalized body aches but does not have a fever here in the ER. Patient's lungs are clear to auscultation. Oxygenation is 100%. No concern for pneumonia. Remainder of examination was unremarkable. Symptoms consistent with viral process, treat symptomatically.  I personally performed the services described in this documentation, which was scribed in my presence. The recorded information has been reviewed and is accurate.     Gilda Crease, MD 06/12/15 [phone]

## 2015-06-12 NOTE — ED Notes (Signed)
Pt states that she is having generalized body aches and headache that began yesterday; pt c/o throat being "scratchy"; pt states "I just don't feel good"

## 2015-06-12 NOTE — Discharge Instructions (Signed)
Upper Respiratory Infection, Adult Most upper respiratory infections (URIs) are a viral infection of the air passages leading to the lungs. A URI affects the nose, throat, and upper air passages. The most common type of URI is nasopharyngitis and is typically referred to as "the common cold." URIs run their course and usually go away on their own. Most of the time, a URI does not require medical attention, but sometimes a bacterial infection in the upper airways can follow a viral infection. This is called a secondary infection. Sinus and middle ear infections are common types of secondary upper respiratory infections. Bacterial pneumonia can also complicate a URI. A URI can worsen asthma and chronic obstructive pulmonary disease (COPD). Sometimes, these complications can require emergency medical care and may be life threatening.  CAUSES Almost all URIs are caused by viruses. A virus is a type of germ and can spread from one person to another.  RISKS FACTORS You may be at risk for a URI if:   You smoke.   You have chronic heart or lung disease.  You have a weakened defense (immune) system.   You are very young or very old.   You have nasal allergies or asthma.  You work in crowded or poorly ventilated areas.  You work in health care facilities or schools. SIGNS AND SYMPTOMS  Symptoms typically develop 2-3 days after you come in contact with a cold virus. Most viral URIs last 7-10 days. However, viral URIs from the influenza virus (flu virus) can last 14-18 days and are typically more severe. Symptoms may include:   Runny or stuffy (congested) nose.   Sneezing.   Cough.   Sore throat.   Headache.   Fatigue.   Fever.   Loss of appetite.   Pain in your forehead, behind your eyes, and over your cheekbones (sinus pain).  Muscle aches.  DIAGNOSIS  Your health care provider may diagnose a URI by:  Physical exam.  Tests to check that your symptoms are not due to  another condition such as:  Strep throat.  Sinusitis.  Pneumonia.  Asthma. TREATMENT  A URI goes away on its own with time. It cannot be cured with medicines, but medicines may be prescribed or recommended to relieve symptoms. Medicines may help:  Reduce your fever.  Reduce your cough.  Relieve nasal congestion. HOME CARE INSTRUCTIONS   Take medicines only as directed by your health care provider.   Gargle warm saltwater or take cough drops to comfort your throat as directed by your health care provider.  Use a warm mist humidifier or inhale steam from a shower to increase air moisture. This may make it easier to breathe.  Drink enough fluid to keep your urine clear or pale yellow.   Eat soups and other clear broths and maintain good nutrition.   Rest as needed.   Return to work when your temperature has returned to normal or as your health care provider advises. You may need to stay home longer to avoid infecting others. You can also use a face mask and careful hand washing to prevent spread of the virus.  Increase the usage of your inhaler if you have asthma.   Do not use any tobacco products, including cigarettes, chewing tobacco, or electronic cigarettes. If you need help quitting, ask your health care provider. PREVENTION  The best way to protect yourself from getting a cold is to practice good hygiene.   Avoid oral or hand contact with people with cold   symptoms.   Wash your hands often if contact occurs.  There is no clear evidence that vitamin C, vitamin E, echinacea, or exercise reduces the chance of developing a cold. However, it is always recommended to get plenty of rest, exercise, and practice good nutrition.  SEEK MEDICAL CARE IF:   You are getting worse rather than better.   Your symptoms are not controlled by medicine.   You have chills.  You have worsening shortness of breath.  You have brown or red mucus.  You have yellow or brown nasal  discharge.  You have pain in your face, especially when you bend forward.  You have a fever.  You have swollen neck glands.  You have pain while swallowing.  You have white areas in the back of your throat. SEEK IMMEDIATE MEDICAL CARE IF:   You have severe or persistent:  Headache.  Ear pain.  Sinus pain.  Chest pain.  You have chronic lung disease and any of the following:  Wheezing.  Prolonged cough.  Coughing up blood.  A change in your usual mucus.  You have a stiff neck.  You have changes in your:  Vision.  Hearing.  Thinking.  Mood. MAKE SURE YOU:   Understand these instructions.  Will watch your condition.  Will get help right away if you are not doing well or get worse.   This information is not intended to replace advice given to you by your health care provider. Make sure you discuss any questions you have with your health care provider.   Document Released: 12/12/2000 Document Revised: 11/02/2014 Document Reviewed: 09/23/2013 Elsevier Interactive Patient Education 2016 [employer]  

## 2015-06-29 ENCOUNTER — Encounter (HOSPITAL_COMMUNITY): Payer: Self-pay | Admitting: Emergency Medicine

## 2015-06-29 ENCOUNTER — Emergency Department (HOSPITAL_COMMUNITY): Payer: Federal, State, Local not specified - PPO

## 2015-06-29 ENCOUNTER — Emergency Department (HOSPITAL_COMMUNITY)
Admission: EM | Admit: 2015-06-29 | Discharge: 2015-06-29 | Disposition: A | Discharge status: Home / Self Care | Payer: Federal, State, Local not specified - PPO | Attending: Emergency Medicine | Admitting: Emergency Medicine

## 2015-06-29 DIAGNOSIS — R05 Cough: Secondary | ICD-10-CM

## 2015-06-29 DIAGNOSIS — R52 Pain, unspecified: Secondary | ICD-10-CM

## 2015-06-29 DIAGNOSIS — J45909 Unspecified asthma, uncomplicated: Secondary | ICD-10-CM | POA: Insufficient documentation

## 2015-06-29 DIAGNOSIS — R059 Cough, unspecified: Secondary | ICD-10-CM

## 2015-06-29 DIAGNOSIS — J069 Acute upper respiratory infection, unspecified: Secondary | ICD-10-CM

## 2015-06-29 HISTORY — DX: Bronchitis, not specified as acute or chronic: J40

## 2015-06-29 MED ORDER — ACETAMINOPHEN 500 MG PO TABS
1000.0000 mg | ORAL_TABLET | Freq: Once | ORAL | Status: AC
  Administered 2015-06-29: 1000 mg via ORAL
  Filled 2015-06-29: qty 2

## 2015-06-29 MED ORDER — BENZONATATE 100 MG PO CAPS
100.0000 mg | ORAL_CAPSULE | Freq: Once | ORAL | Status: AC
  Administered 2015-06-29: 100 mg via ORAL
  Filled 2015-06-29: qty 1

## 2015-06-29 MED ORDER — BENZONATATE 100 MG PO CAPS: 100.0000 mg | ORAL_CAPSULE | Freq: Three times a day (TID) | ORAL | Status: DC

## 2015-06-29 MED ORDER — ONDANSETRON 4 MG PO TBDP: 4.0000 mg | ORAL_TABLET | Freq: Three times a day (TID) | ORAL | Status: DC | PRN

## 2015-06-29 NOTE — ED Provider Notes (Signed)
CSN: [id number]     Arrival date & time 06/29/15  0207 History   First MD Initiated Contact with Patient 06/29/15 0602     Chief Complaint  Patient presents with  . Cough  . Generalized Body Aches     (Consider location/radiation/quality/duration/timing/severity/associated sxs/prior Treatment) HPI   Marie Kramer is a 39 y.o. female, with a history of asthma and bronchitis, presenting to the ED with nonproductive cough and body aches for the last 24 hours. Patient rates her body aches at 6/10, is in all her muscles, and does not radiate as far as she can tell. Pt has not taken anything for her symptoms. Pt denies shortness of breath, chest pain, N/V/C/D, abdominal pain, dizziness, or any other complaints.    Past Medical History  Diagnosis Date  . Asthma   . Bronchitis    Past Surgical History  Procedure Laterality Date  . Tubal ligation     No family history on file. Social History  Substance Use Topics  . Smoking status: Never Smoker   . Smokeless tobacco: None  . Alcohol Use: No   OB History    No data available     Review of Systems  Constitutional: Negative for fever, chills and diaphoresis.  Respiratory: Positive for cough. Negative for chest tightness and shortness of breath.   Cardiovascular: Negative for chest pain.  Gastrointestinal: Negative for nausea, vomiting, abdominal pain, diarrhea and constipation.  Musculoskeletal: Positive for myalgias (Generalized body aches). Negative for neck stiffness.  Neurological: Negative for dizziness, light-headedness and headaches.  All other systems reviewed and are negative.     Allergies  Review of patient's allergies indicates no known allergies.  Home Medications   Prior to Admission medications   Medication Sig Start Date End Date Taking? Authorizing Provider  benzonatate (TESSALON) 100 MG capsule Take 1 capsule (100 mg total) by mouth every 8 (eight) hours. 06/29/15   Bell Cai C Nixie Laube, PA-C  ondansetron  (ZOFRAN ODT) 4 MG disintegrating tablet Take 1 tablet (4 mg total) by mouth every 8 (eight) hours as needed for nausea or vomiting. 06/29/15   Kierstan Auer C Reta Norgren, PA-C   BP 118/76 mmHg  Pulse 56  Temp(Src) 98.4 F (36.9 C) (Oral)  Resp 16  SpO2 100%  LMP 05/23/2015 Physical Exam  Constitutional: She appears well-developed and well-nourished. No distress.  HENT:  Head: Normocephalic and atraumatic.  Eyes: Conjunctivae are normal. Pupils are equal, round, and reactive to light.  Neck: Normal range of motion. Neck supple.  Cardiovascular: Normal rate, regular rhythm and normal heart sounds.   Pulmonary/Chest: Effort normal and breath sounds normal. No respiratory distress.  Abdominal: Soft. Bowel sounds are normal.  Musculoskeletal: She exhibits no edema or tenderness.  Lymphadenopathy:    She has no cervical adenopathy.  Neurological: She is alert.  Skin: Skin is warm and dry. She is not diaphoretic.  Nursing note and vitals reviewed.   ED Course  Procedures (including critical care time) Labs Review Labs Reviewed - No data to display  Imaging Review Dg Chest 2 View  06/29/2015  CLINICAL DATA:  39 year old female with bronchitis and chest pain. EXAM: CHEST  2 VIEW COMPARISON:  Chest radiograph dated 09/06/2013 FINDINGS: The heart size and mediastinal contours are within normal limits. Both lungs are clear. The visualized skeletal structures are unremarkable. IMPRESSION: No active cardiopulmonary disease. Electronically Signed   By: Elgie CollardArash  Radparvar M.D.   On: 06/29/2015 02:46   I have personally reviewed and evaluated these images and  lab results as part of my medical decision-making.   EKG Interpretation None      MDM   Final diagnoses:  URI (upper respiratory infection)  Cough    Marie Kramer presents with cough and generalized body aches for the past 24 hours.  Patient's presentation is consistent with a virus such as influenza versus viral URI. Patient is nontoxic  appearing, not tachycardic, not tachypneic, is afebrile, maintains SPO2 of 100% on room air, and is in no apparent distress. Patient was given instructions for home care as well as return precautions. Patient voiced understanding of these instructions and is comfortable with discharge.  Filed Vitals:   06/29/15 0213 06/29/15 0648 06/29/15 0718  BP: 120/61 118/76   Pulse: 77 56   Temp: 98.4 F (36.9 C)  98.4 F (36.9 C)  TempSrc: Oral  Oral  Resp: 18 16   SpO2: 100% 100%      Anselm Pancoast, PA-C 06/29/15 0725  Alvira Monday, MD 06/29/15 2140

## 2015-06-29 NOTE — Discharge Instructions (Signed)
You have been seen today for cough and body aches. Your imaging showed no abnormalities. Follow up with PCP as needed. Return to ED should symptoms worsen. Tylenol or Motrin for pain or fever. Over-the-counter decongestants such as Mucinex or Sudafed may be used. Drink plenty of fluids and get plenty of rest.   Emergency Department Resource Guide 1) Find a Doctor and Pay Out of Pocket Although you won't have to find out who is covered by your insurance plan, it is a good idea to ask around and get recommendations. You will then need to call the office and see if the doctor you have chosen will accept you as a new patient and what types of options they offer for patients who are self-pay. Some doctors offer discounts or will set up payment plans for their patients who do not have insurance, but you will need to ask so you aren't surprised when you get to your appointment.  2) Contact Your Local Health Department Not all health departments have doctors that can see patients for sick visits, but many do, so it is worth a call to see if yours does. If you don't know where your local health department is, you can check in your phone book. The CDC also has a tool to help you locate your state's health department, and many state websites also have listings of all of their local health departments.  3) Find a Walk-in Clinic If your illness is not likely to be very severe or complicated, you may want to try a walk in clinic. These are popping up all over the country in pharmacies, drugstores, and shopping centers. They're usually staffed by nurse practitioners or physician assistants that have been trained to treat common illnesses and complaints. They're usually fairly quick and inexpensive. However, if you have serious medical issues or chronic medical problems, these are probably not your best option.  No Primary Care Doctor: - Call Health Connect at  [phone] - they can help you locate a primary care doctor  that  accepts your insurance, provides certain services, etc. - Physician Referral Service- [phone]  Chronic Pain Problems: Organization         Address  Phone   Notes  [facility]  [phone] Patients need to be referred by their primary care doctor.   Medication Assistance: Organization         Address  Phone   Notes  [facility] [address] [place], [place] [address] [phone] --Must be a resident of [facility] [place] -- Must have NO insurance coverage whatsoever (no Medicaid/ Medicare, etc.) -- The pt. MUST have a primary care doctor that directs their care regularly and follows them in the community   MedAssist  [phone]   [employer]  [phone]    Agencies that provide inexpensive medical care: Organization         Address  Phone   Notes  [facility]  [phone]   [facility]    [phone]   [facility] [address] [place], [place] [address] [phone]   [facility] [address], [place] [phone]   [facility]    [phone]) [phone]   [facility]    [phone]) [phone]   [facility]  [address], [place] Phone:  [phone], Fax:  [phone] Hours of  Operation:  9 am - 6 pm, M-F.  Also accepts Medicaid/Medicare and self-pay.  [facility] [place], [address], [place] Phone: [phone], Fax: [phone]. Hours of Operation:  8:30 am - 5:30 pm, M-F.  Also accepts Medicaid and self-pay.  [facility] [place] [address] [place] Phone: [phone]   [place], [place], [place] [phone], Ext. [phone] Mondays & Thursdays: 7-9 AM.  First 15 patients are seen on a first come, first serve basis.    [place] Providers:  Organization          Address  Phone   Notes  [facility] [address], [place] [phone] Also accepts self-pay patients.  [facility] [id number] [place], [place]  [phone]   [place], [address], [place] [phone]   [facility] [address], [place] [phone]   Lucianne Lei [address], [place]   [phone] Only accepts [place] Access [place] patients after they have their name applied to their card.   Self-Pay (no insurance) in [facility]:  Organization         Address  Phone   Notes  Sickle Cell Patients, [facility] [place] [phone]   [facility] [place] [phone]   [facility] [place]  [place], [place], [place] [phone]   [facility]/Dr. Osei-Bonsu  [address] [place] or [address], [place] [phone] Phone number for both [place] and [place] locations is the same.  Urgent Medical and [facility] [address] [place] [phone]   [facility] [address], [place] or [address] [phone]   [facility] [address] [place] [phone], phone; [phone], fax Sees patients 1st and 3rd Saturday of every month.  Must not qualify for public or private insurance (i.e. Medicaid, Medicare, [place] Health Choice, Veterans' Benefits)  Household income should be no more than 200% of the poverty level The clinic cannot treat you if you are pregnant or think you are pregnant  Sexually transmitted diseases are not treated at the clinic.    Dental Care: Organization         Address  Phone  Notes  [facility] Department of [facility] [place] [phone] Accepts children up to age 83 who are enrolled in [place] or [place]; pregnant women with a Medicaid card; and children who have applied for Medicaid or [place] Health Choice, but were declined, whose parents can pay a reduced fee at time of service.  [facility] Department of [facility]  [address], [place] [phone] Accepts children up to age 32 who are enrolled in [place] or [place]; pregnant women with a Medicaid card; and children who have applied for Medicaid or [place] Health Choice, but were declined, whose parents can pay a reduced fee at time of service.  [place] Adult Dental Access PROGRAM  [place] [phone] Patients are seen by appointment only. Walk-ins are not accepted. [place] will see patients  36 years of age and older. Monday - Tuesday (8am-5pm) Most Wednesdays (8:30-5pm) $30 per visit, cash only  [facility] Adult Dental Access PROGRAM  [address], [facility] [phone] Patients are seen by appointment only. Walk-ins are not accepted. [place] will see patients 34 years of age and older. One Wednesday Evening (Monthly: Volunteer Based).  $30 per visit, cash only  [place]  [phone] for adults; Children under age 73, call Graduate Pediatric Dentistry at [phone]. Children aged 46-14, please call [phone] to request a pediatric application.  Dental services are provided in all areas of dental care including fillings, crowns and bridges, complete and partial dentures, implants, gum treatment, root canals, and extractions. Preventive care is also provided. Treatment is provided to both adults and children. Patients are selected via a lottery and there is often a waiting list.   [facility] [address] [place]  [phone] [email or web address]   [facility] [address] [place], [place]  [phone], Ext. [phone] Second and Fourth Thursday of each month, opens at 6:30 AM; Clinic ends at 9 AM.  Patients are seen on a first-come first-served basis, and a limited number are seen during each clinic.   [facility]  [address] [place]ard D[place]er [place], [place] [phone]   Eligibility Requirements You must have lived in [place], [place], or [place] counties for at least the last three months.   You cannot be eligible for state or federal sponsored [employer], including [employer], [place], or [employer].   You generally cannot be eligible for healthcare insurance through your employer.    How to apply: Eligibility screenings are held every Tuesday and Wednesday afternoon from 1:00 pm until 4:00 pm. You do not need an appointment for the interview!  [facility] [address] [place], [place]   [place]  [place] Department  [place]  [phone]    Behavioral Health Resources in the Community: Intensive Outpatient Programs Organization         Address  Phone  Notes  [facility] [place]. [address] [place], [place] [phone]   [facility] [address], [place], [place]   ADS: Alcohol [facility] S[facility] [address], [place], [place]   [place] [address]  [place], [place] or [phone]   Substance Abuse Resources Organization         Address  Phone  Notes  Alcohol and Drug Services  [phone]   [place]  [phone]   The [place]   Chinita Pester  [phone]   Residential & Outpatient Substance Abuse Program  [phone]   Psychological Services Organization         Address  Phone  Notes  [facility] [place]  [place]  [phone]    [place] [address], [place] or [phone]    Mobile Crisis Teams Organization         Address  Phone  Notes  [facility], Mobile Crisis Care Unit  [phone]   [facility]  [address] [place], [place]   [facility] [address] [place] [phone]    Self-Help/Support Groups Organization  Address  Phone             Notes  [place]. of [place] - variety of support groups  [place] Call for more information  Narcotics Anonymous (NA), [facility] [address], [employer] [place]  2 meetings at this location   Special educational needs teacher         Address  Phone  Notes  [facility] Residential Treatment [place],    [place]  [phone]   [facility]  [address] [place] [address], [place], [place]   [place], [place] [phone] Admissions: 8am-3pm M-F  Incentives Substance [place] [address],    [place], [place] [phone]   [facility] [address] [place], [place], [place]   [facility] [address]  [place], [place]   Insight Programs - Intensive Outpatient [address], [place], [place]   [facility] ([place].,  [place], [place] [phone])[phone] or [phone]   [facility] Treatment Services (RTS) [address] [place], [place] Accepts Medicaid  [facility] [place] [address]  [place] [phone] Substance Abuse/Addiction Treatment   [facility] Organization         Address  Phone  Notes  [facility]  [phone]   Domenic Schwab, PhD [address] Arlis [place], [place]   [phone] or [phone]   [place]  [place], [place] [phone]   [facility] [address] [place], [place] [phone] Insurance/Medicaid/sponsorship through [facility] and Families [address] [place]                                    [place], [place] [phone] [place] [address], [place] [phone]    Dr. Adele Schilder  [phone]   [facility] Dept. 1) [address], [place] 2) [place] 3)  [place] [address], [place] [phone]  [phone]   [place] [phone] or [phone] (After Hours)

## 2015-06-29 NOTE — ED Notes (Signed)
Patient presents for cough and generalized body aches, diagnosed with bronchitis earlier this month, complete round of antibiotics, but reports no relief of symptoms. Non productive cough, fever at home, denies N/V.

## 2015-06-29 NOTE — ED Notes (Signed)
Patient given apple juice to drink by Tech.

## 2015-06-29 NOTE — ED Notes (Signed)
Pt able to tolerate fluids with no complaint, distress, or difficulty.

## 2015-08-25 ENCOUNTER — Other Ambulatory Visit: Payer: Self-pay

## 2015-10-24 ENCOUNTER — Other Ambulatory Visit: Payer: Self-pay | Admitting: Obstetrics and Gynecology

## 2015-10-24 DIAGNOSIS — R921 Mammographic calcification found on diagnostic imaging of breast: Secondary | ICD-10-CM

## 2015-10-25 ENCOUNTER — Emergency Department (HOSPITAL_COMMUNITY): Payer: Federal, State, Local not specified - PPO

## 2015-10-25 ENCOUNTER — Emergency Department (HOSPITAL_COMMUNITY)
Admission: EM | Admit: 2015-10-25 | Discharge: 2015-10-25 | Disposition: A | Discharge status: Home / Self Care | Payer: Federal, State, Local not specified - PPO | Attending: Emergency Medicine | Admitting: Emergency Medicine

## 2015-10-25 DIAGNOSIS — Y9389 Activity, other specified: Secondary | ICD-10-CM | POA: Diagnosis not present

## 2015-10-25 DIAGNOSIS — W01198A Fall on same level from slipping, tripping and stumbling with subsequent striking against other object, initial encounter: Secondary | ICD-10-CM | POA: Insufficient documentation

## 2015-10-25 DIAGNOSIS — W19XXXA Unspecified fall, initial encounter: Secondary | ICD-10-CM | POA: Diagnosis not present

## 2015-10-25 DIAGNOSIS — Y990 Civilian activity done for income or pay: Secondary | ICD-10-CM | POA: Diagnosis not present

## 2015-10-25 DIAGNOSIS — Y9289 Other specified places as the place of occurrence of the external cause: Secondary | ICD-10-CM | POA: Insufficient documentation

## 2015-10-25 DIAGNOSIS — S46911A Strain of unspecified muscle, fascia and tendon at shoulder and upper arm level, right arm, initial encounter: Secondary | ICD-10-CM | POA: Insufficient documentation

## 2015-10-25 DIAGNOSIS — S46811A Strain of other muscles, fascia and tendons at shoulder and upper arm level, right arm, initial encounter: Secondary | ICD-10-CM | POA: Diagnosis not present

## 2015-10-25 DIAGNOSIS — J45909 Unspecified asthma, uncomplicated: Secondary | ICD-10-CM | POA: Diagnosis not present

## 2015-10-25 DIAGNOSIS — S8002XA Contusion of left knee, initial encounter: Secondary | ICD-10-CM | POA: Insufficient documentation

## 2015-10-25 DIAGNOSIS — Z79899 Other long term (current) drug therapy: Secondary | ICD-10-CM | POA: Diagnosis not present

## 2015-10-25 DIAGNOSIS — S4991XA Unspecified injury of right shoulder and upper arm, initial encounter: Secondary | ICD-10-CM | POA: Diagnosis present

## 2015-10-25 NOTE — ED Notes (Signed)
Bed: WTR7 Expected date:  Expected time:  Means of arrival:  Comments: lab 

## 2015-10-25 NOTE — ED Notes (Signed)
Pt fell at work today and she complains of left knee pain and pain at the top of her right shoulder

## 2015-10-25 NOTE — ED Provider Notes (Signed)
CSN: [id number]     Arrival date & time 10/25/15  0325 History   First MD Initiated Contact with Patient 10/25/15 [phone]     Chief Complaint  Patient presents with  . Knee Pain  . Shoulder Pain     (Consider location/radiation/quality/duration/timing/severity/associated sxs/prior Treatment) HPI Comments: 40 year old female with asthma who presents with left knee and right shoulder pain. Just prior to arrival, the patient was at work and slipped on water, falling and striking her left knee and right shoulder. She did not lose consciousness or hit her head. She denies any back, chest, or abdominal pain. She has been ambulatory since the event. Her pain is worse with joint movements, mild at rest. No anticoagulant use.  Patient is a 40 y.o. female presenting with knee pain and shoulder pain. The history is provided by the patient.  Knee Pain Shoulder Pain   Past Medical History  Diagnosis Date  . Asthma   . Bronchitis    Past Surgical History  Procedure Laterality Date  . Tubal ligation     No family history on file. Social History  Substance Use Topics  . Smoking status: Never Smoker   . Smokeless tobacco: Not on file  . Alcohol Use: No   OB History    No data available     Review of Systems 10 Systems reviewed and are negative for acute change except as noted in the HPI.    Allergies  Review of patient's allergies indicates no known allergies.  Home Medications   Prior to Admission medications   Medication Sig Start Date End Date Taking? Authorizing Provider  benzonatate (TESSALON) 100 MG capsule Take 1 capsule (100 mg total) by mouth every 8 (eight) hours. 06/29/15   Shawn C Joy, PA-C  ondansetron (ZOFRAN ODT) 4 MG disintegrating tablet Take 1 tablet (4 mg total) by mouth every 8 (eight) hours as needed for nausea or vomiting. 06/29/15   Shawn C Joy, PA-C   BP 103/76 mmHg  Pulse 60  Temp(Src) 98.1 F (36.7 C) (Oral)  Resp 16  SpO2 99%  LMP 10/04/2015 Physical  Exam  Constitutional: She is oriented to person, place, and time. She appears well-developed and well-nourished. No distress.  HENT:  Head: Normocephalic and atraumatic.  Moist mucous membranes  Eyes: Conjunctivae are normal.  Neck: Normal range of motion. Neck supple.  Cardiovascular: Normal rate, regular rhythm, normal heart sounds and intact distal pulses.   No murmur heard. Pulmonary/Chest: Effort normal and breath sounds normal. She exhibits no tenderness.  Abdominal: Soft. Bowel sounds are normal. She exhibits no distension. There is no tenderness.  Musculoskeletal: She exhibits tenderness. She exhibits no edema.       Arms:      Legs: L knee TTP, normal ROM; TTP R trapezius muscle near R shoulder w/ normal ROM of shoulder, elbow; 5/5 strength and normal sensation x all 4 ext  Neurological: She is alert and oriented to person, place, and time.  Fluent speech  Skin: Skin is warm and dry. No erythema.  Psychiatric: She has a normal mood and affect. Judgment normal.  Nursing note and vitals reviewed.   ED Course  Procedures (including critical care time) Labs Review Labs Reviewed - No data to display  Imaging Review Dg Shoulder Right  10/25/2015  CLINICAL DATA:  Fall at work with right shoulder pain. Initial encounter. EXAM: RIGHT SHOULDER - 2+ VIEW COMPARISON:  None. FINDINGS: There is no evidence of fracture or dislocation. There is no evidence  of arthropathy or other focal bone abnormality. Soft tissues are unremarkable. IMPRESSION: Negative. Electronically Signed   By: Marnee Spring M.D.   On: 10/25/2015 05:14   Dg Knee 2 Views Left  10/25/2015  CLINICAL DATA:  Fall at work with generalized left knee pain. Initial encounter. EXAM: LEFT KNEE - 1-2 VIEW COMPARISON:  01/18/2013 FINDINGS: There is no evidence of fracture, dislocation, or joint effusion. There is no evidence of arthropathy or other focal bone abnormality. Soft tissues are unremarkable. IMPRESSION: Negative.  Electronically Signed   By: Marnee Spring M.D.   On: 10/25/2015 05:14     MDM   Final diagnoses:  Right shoulder strain, initial encounter  Knee contusion, left, initial encounter   Patient with right shoulder and left knee pain after a fall from standing. No loss of consciousness. She was well-appearing with normal vital signs. Normal range of motion of left knee and right shoulder. She was neurovascularly intact. Plain films negative. Discussed supportive care instructions including ice, NSAIDs, and early range of motion. Instructed to follow-up with PCP in one week if not improved. Patient voiced understanding and was discharged in satisfactory condition.   Laurence Spates, MD 10/25/15 [phone]

## 2015-11-14 DIAGNOSIS — Z1231 Encounter for screening mammogram for malignant neoplasm of breast: Secondary | ICD-10-CM | POA: Diagnosis not present

## 2015-11-14 DIAGNOSIS — N9069 Other specified hypertrophy of vulva: Secondary | ICD-10-CM | POA: Diagnosis not present

## 2015-11-14 DIAGNOSIS — Z6834 Body mass index (BMI) 34.0-34.9, adult: Secondary | ICD-10-CM | POA: Diagnosis not present

## 2015-11-14 DIAGNOSIS — N898 Other specified noninflammatory disorders of vagina: Secondary | ICD-10-CM | POA: Diagnosis not present

## 2015-11-14 DIAGNOSIS — Z113 Encounter for screening for infections with a predominantly sexual mode of transmission: Secondary | ICD-10-CM | POA: Diagnosis not present

## 2015-11-14 DIAGNOSIS — N944 Primary dysmenorrhea: Secondary | ICD-10-CM | POA: Diagnosis not present

## 2015-11-14 DIAGNOSIS — Z1389 Encounter for screening for other disorder: Secondary | ICD-10-CM | POA: Diagnosis not present

## 2015-11-14 DIAGNOSIS — Z130 Encounter for screening for diseases of the blood and blood-forming organs and certain disorders involving the immune mechanism: Secondary | ICD-10-CM | POA: Diagnosis not present

## 2015-11-14 DIAGNOSIS — Z01419 Encounter for gynecological examination (general) (routine) without abnormal findings: Secondary | ICD-10-CM | POA: Diagnosis not present

## 2015-11-25 ENCOUNTER — Ambulatory Visit
Admission: RE | Admit: 2015-11-25 | Discharge: 2015-11-25 | Disposition: A | Discharge status: Home / Self Care | Payer: Federal, State, Local not specified - PPO | Source: Ambulatory Visit | Attending: Obstetrics and Gynecology | Admitting: Obstetrics and Gynecology

## 2015-11-25 DIAGNOSIS — R921 Mammographic calcification found on diagnostic imaging of breast: Secondary | ICD-10-CM | POA: Diagnosis not present

## 2016-02-09 DIAGNOSIS — L0233 Carbuncle of buttock: Secondary | ICD-10-CM | POA: Diagnosis not present

## 2016-03-06 ENCOUNTER — Emergency Department (HOSPITAL_COMMUNITY): Payer: Federal, State, Local not specified - PPO

## 2016-03-06 ENCOUNTER — Encounter (HOSPITAL_COMMUNITY): Payer: Self-pay | Admitting: Emergency Medicine

## 2016-03-06 ENCOUNTER — Emergency Department (HOSPITAL_COMMUNITY)
Admission: EM | Admit: 2016-03-06 | Discharge: 2016-03-06 | Disposition: A | Discharge status: Home / Self Care | Payer: Federal, State, Local not specified - PPO | Attending: Emergency Medicine | Admitting: Emergency Medicine

## 2016-03-06 DIAGNOSIS — R0602 Shortness of breath: Secondary | ICD-10-CM | POA: Insufficient documentation

## 2016-03-06 DIAGNOSIS — R110 Nausea: Secondary | ICD-10-CM | POA: Diagnosis not present

## 2016-03-06 DIAGNOSIS — R05 Cough: Secondary | ICD-10-CM | POA: Diagnosis not present

## 2016-03-06 DIAGNOSIS — R42 Dizziness and giddiness: Secondary | ICD-10-CM | POA: Insufficient documentation

## 2016-03-06 DIAGNOSIS — J45909 Unspecified asthma, uncomplicated: Secondary | ICD-10-CM | POA: Diagnosis not present

## 2016-03-06 DIAGNOSIS — R0789 Other chest pain: Secondary | ICD-10-CM | POA: Insufficient documentation

## 2016-03-06 DIAGNOSIS — R079 Chest pain, unspecified: Secondary | ICD-10-CM | POA: Diagnosis not present

## 2016-03-06 LAB — BASIC METABOLIC PANEL
Anion gap: 6 (ref 5–15)
BUN: 17 mg/dL (ref 6–20)
CALCIUM: 9.3 mg/dL (ref 8.9–10.3)
CHLORIDE: 111 mmol/L (ref 101–111)
CO2: 23 mmol/L (ref 22–32)
Creatinine, Ser: 0.87 mg/dL (ref 0.44–1.00)
GFR calc non Af Amer: >60 mL/min (ref >60)
GLUCOSE: 94 mg/dL (ref 65–99)
Potassium: 3.7 mmol/L (ref 3.5–5.1)
Sodium: 140 mmol/L (ref 135–145)

## 2016-03-06 LAB — CBC
HCT: 34.3 % — ABNORMAL LOW (ref 36.0–46.0)
HEMOGLOBIN: 10.9 g/dL — AB (ref 12.0–15.0)
MCH: 25.6 pg — AB (ref 26.0–34.0)
MCHC: 31.8 g/dL (ref 30.0–36.0)
MCV: 80.5 fL (ref 78.0–100.0)
Platelets: 141 10*3/uL — ABNORMAL LOW (ref 150–400)
RBC: 4.26 MIL/uL (ref 3.87–5.11)
RDW: 15.1 % (ref 11.5–15.5)
WBC: 3.7 10*3/uL — ABNORMAL LOW (ref 4.0–10.5)

## 2016-03-06 LAB — I-STAT TROPONIN, ED: Troponin i, poc: 0 ng/mL (ref 0.00–0.08)

## 2016-03-06 MED ORDER — ONDANSETRON 4 MG PO TBDP: 4.0000 mg | ORAL_TABLET | Freq: Three times a day (TID) | ORAL | 0 refills | Status: DC | PRN

## 2016-03-06 MED ORDER — ONDANSETRON 4 MG PO TBDP
4.0000 mg | ORAL_TABLET | Freq: Once | ORAL | Status: AC
  Administered 2016-03-06: 4 mg via ORAL
  Filled 2016-03-06: qty 1

## 2016-03-06 NOTE — ED Notes (Signed)
Water given  

## 2016-03-06 NOTE — Progress Notes (Signed)
Patient listed as having BCBS insurance without a pcp.  EDCM provided patient with list of providers who accept BCBS insurance within a 15 mile radius of patient's zip code [phone].  Discussed importance of establishing care with pcp.  Patient thankful for resources.  No further EDCM needs at this time.

## 2016-03-06 NOTE — ED Triage Notes (Signed)
Patient c/o of chest pain with shortness of breath starting this morning. Denies vomiting and diarrhea.

## 2016-03-06 NOTE — ED Provider Notes (Signed)
[facility]-EMERGENCY DEPT Provider Note   CSN: [id number] Arrival date & time: 03/06/16  1412     History   Chief Complaint Chief Complaint  Patient presents with  . Chest Pain    HPI Marie Kramer is a 40 y.o. female.  The history is provided by the patient.  Chest Pain   This is a recurrent problem. Associated symptoms include nausea and shortness of breath. Pertinent negatives include no abdominal pain, no back pain, no headaches, no numbness, no vomiting and no weakness.  Patient presents with chest pain or shortness of breath. States she had episode last around 15 minutes today. States pain resolved now. States she was cleaning when she began to feel some mid chest sharp tightness. Also shortness of breath with nausea. States she felt lightheaded with it. No fevers or chills. No real production with her occasional cough. No swelling or legs. She states she has episodes like this before better not associated with exertion. States this was just like her previous episodes except that it lasted longer. She's been doing well otherwise last couple days. No recent travel. She does not smoke cigarettes. No diarrhea. No abdominal pain.  Past Medical History:  Diagnosis Date  . Asthma   . Bronchitis     There are no active problems to display for this patient.   Past Surgical History:  Procedure Laterality Date  . TUBAL LIGATION      OB History    No data available       Home Medications    Prior to Admission medications   Medication Sig Start Date End Date Taking? Authorizing Provider  benzonatate (TESSALON) 100 MG capsule Take 1 capsule (100 mg total) by mouth every 8 (eight) hours. Patient not taking: Reported on 03/06/2016 06/29/15   Shawn C Joy, PA-C  ondansetron (ZOFRAN ODT) 4 MG disintegrating tablet Take 1 tablet (4 mg total) by mouth every 8 (eight) hours as needed for nausea or vomiting. 03/06/16   Benjiman CoreNathan Bennie Scaff, MD    Family History No family history on  file.  Social History Social History  Substance Use Topics  . Smoking status: Never Smoker  . Smokeless tobacco: Never Used  . Alcohol use No     Allergies   Review of patient's allergies indicates no known allergies.   Review of Systems Review of Systems  Constitutional: Negative for activity change and appetite change.  Eyes: Negative for pain.  Respiratory: Positive for shortness of breath. Negative for chest tightness.   Cardiovascular: Positive for chest pain. Negative for leg swelling.  Gastrointestinal: Positive for nausea. Negative for abdominal pain, diarrhea and vomiting.  Genitourinary: Negative for flank pain.  Musculoskeletal: Negative for back pain and neck stiffness.  Skin: Negative for rash.  Neurological: Positive for light-headedness. Negative for weakness, numbness and headaches.  Psychiatric/Behavioral: Negative for behavioral problems.  All other systems reviewed and are negative.    Physical Exam Updated Vital Signs BP 113/79 (BP Location: Right Arm)   Pulse 62   Temp 99 F (37.2 C) (Oral)   Resp 18   Ht 5\' 3"  (1.6 m)   Wt 185 lb (83.9 kg)   LMP 02/19/2016   SpO2 100%   BMI 32.77 kg/m   Physical Exam  Constitutional: She is oriented to person, place, and time. She appears well-developed and well-nourished.  HENT:  Head: Normocephalic and atraumatic.  Eyes: Pupils are equal, round, and reactive to light.  Neck: Neck supple. No JVD present.  Cardiovascular: Normal  rate and regular rhythm.   Pulmonary/Chest: Effort normal and breath sounds normal. No respiratory distress. She has no wheezes.  Abdominal: Soft. Bowel sounds are normal. She exhibits no distension. There is no tenderness.  Musculoskeletal: Normal range of motion.  Neurological: She is alert and oriented to person, place, and time.  Skin: Skin is warm and dry.  Psychiatric: She has a normal mood and affect. Her speech is normal.  Nursing note and vitals reviewed.    ED  Treatments / Results  Labs (all labs ordered are listed, but only abnormal results are displayed) Labs Reviewed  CBC - Abnormal; Notable for the following:       Result Value   WBC 3.7 (*)    Hemoglobin 10.9 (*)    HCT 34.3 (*)    MCH 25.6 (*)    Platelets 141 (*)    All other components within normal limits  BASIC METABOLIC PANEL  I-STAT TROPOININ, ED    EKG  EKG Interpretation  Date/Time:  Tuesday March 06 2016 14:27:32 EDT Ventricular Rate:  56 PR Interval:    QRS Duration: 91 QT Interval:  386 QTC Calculation: 373 R Axis:   62 Text Interpretation:  Sinus rhythm Baseline wander in lead(s) II III Confirmed by Rubin Payor  MD, Harrold Donath [phone]) on 03/06/2016 5:18:15 PM       Radiology Dg Chest 2 View  Result Date: 03/06/2016 CLINICAL DATA:  Chest pain with shortness of breath and coughing for several days EXAM: CHEST  2 VIEW COMPARISON:  06/21/2015 FINDINGS: The heart size and mediastinal contours are within normal limits. Both lungs are clear. The visualized skeletal structures are unremarkable. IMPRESSION: No active cardiopulmonary disease. Electronically Signed   By: Elige Ko   On: 03/06/2016 15:15    Procedures Procedures (including critical care time)  Medications Ordered in ED Medications  ondansetron (ZOFRAN-ODT) disintegrating tablet 4 mg (4 mg Oral Given 03/06/16 1745)     Initial Impression / Assessment and Plan / ED Course  I have reviewed the triage vital signs and the nursing notes.  Pertinent labs & imaging results that were available during my care of the patient were reviewed by me and considered in my medical decision making (see chart for details).  Clinical Course    Patient presents with episode of chest pain and nausea. Feels better now. EKG and lab work reassuring. Still has some nausea but feels better after Zofran is tolerating orals. Doubt cardiac cause. Will discharge home. No arrhythmia while she's been here. Will discharge  home.  Final Clinical Impressions(s) / ED Diagnoses   Final diagnoses:  Nausea  Chest pain, unspecified chest pain type    New Prescriptions Current Discharge Medication List       Benjiman Core, MD 03/06/16 1815

## 2016-04-25 ENCOUNTER — Ambulatory Visit (INDEPENDENT_AMBULATORY_CARE_PROVIDER_SITE_OTHER): Payer: Federal, State, Local not specified - PPO | Admitting: Family Medicine

## 2016-04-25 ENCOUNTER — Ambulatory Visit (INDEPENDENT_AMBULATORY_CARE_PROVIDER_SITE_OTHER): Payer: Federal, State, Local not specified - PPO

## 2016-04-25 VITALS — BP 102/60 | HR 61 | Temp 98.7°F | Resp 16 | Ht 66.0 in | Wt 193.0 lb

## 2016-04-25 DIAGNOSIS — S63502A Unspecified sprain of left wrist, initial encounter: Secondary | ICD-10-CM | POA: Diagnosis not present

## 2016-04-25 DIAGNOSIS — M25532 Pain in left wrist: Secondary | ICD-10-CM

## 2016-04-25 MED ORDER — IBUPROFEN 800 MG PO TABS: 800.0000 mg | ORAL_TABLET | Freq: Three times a day (TID) | ORAL | 1 refills | Status: DC | PRN

## 2016-04-25 NOTE — Patient Instructions (Addendum)
I think you have strained your wrist.  This means that you have an overuse injury of the ligaments and tendons around her wrist.  It would be best that your wrist rest for the next several days. We will be checking a note to let you get back to work on Monday.  Use the ibuprofen 800 mg as needed for both pain relief and anti-inflammatory.  Pickup a wrist brace such as an ace brace from any pharmacy in town. Use this to see how it works before you go back to work.  Use ice on the back of the wrist to help as well. This plus rest and the brace as compression will be helpful.  It was good to see you today!    IF you received an x-ray today, you will receive an invoice from [facility]. Please contact [facility] at [phone] with questions or concerns regarding your invoice.   IF you received labwork today, you will receive an invoice from [facility]. Please contact [facility] at [phone] with questions or concerns regarding your invoice.   Our billing staff will not be able to assist you with questions regarding bills from these companies.  You will be contacted with the lab results as soon as they are available. The fastest way to get your results is to activate your My Chart account. Instructions are located on the last page of this paperwork. If you have not heard from us regarding the results in 2 weeks, please contact this office.

## 2016-04-25 NOTE — Progress Notes (Signed)
   Marie Kramer is a 40 y.o. female who presents to [facility] today for Left wrist pain:  1.  Left wrist pain:  Present for the past 4 days or so. She works at the [employer] on night shift and states she is constantly picking up, grabbing, many things. She is right-handed. Her left hand is the weaker hand. She has tried over-the-counter Tylenol and Advil without relief. She's not tried anything else for relief. No falls or recent injuries. No history of gout. No swelling or redness of her wrist. No recent illnesses.  ROS as above.    PMH reviewed. Patient is a nonsmoker.   Past Medical History:  Diagnosis Date  . Asthma   . Bronchitis    Past Surgical History:  Procedure Laterality Date  . TUBAL LIGATION      Medications reviewed. No current outpatient prescriptions on file.   No current facility-administered medications for this visit.    Family history: Father with a history of high cholesterol. Otherwise her mother, brothers are all healthy. No family history of gout or other joint disease.   Physical Exam:  BP 102/60 (BP Location: Right Arm, Patient Position: Sitting, Cuff Size: Large)   Pulse 61   Temp 98.7 F (37.1 C)   Resp 16   Ht 5\' 6"  (1.676 m)   Wt 193 lb (87.5 kg)   LMP 03/21/2016   SpO2 98%   BMI 31.15 kg/m  Gen:  Alert, cooperative patient who appears stated age in no acute distress.  Vital signs reviewed. MSK: Right wrist in the normal limits -Left wrist: No noticeable swelling. No redness. No effusion. Does have some tenderness along the base of the third carpal bone. Also tenderness along the abductor pollicis longus tendon and the base of left thumb. Strength is 4 out of 5 left hand grip. 4-5 left thumb opposition. This is compared to 5 out of 5 right hand grip and opposition strength testing. She has no noticeable atrophy left hand.  Assessment and Plan:  1.  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   -Radiographs here were negative. -Rest, ice, compression. Ibuprofen 800 mg for pain relief. -She can go back to work on Monday. - We have written her a note to be out until then. - FU if no improvement in next several days.

## 2016-06-11 ENCOUNTER — Ambulatory Visit (INDEPENDENT_AMBULATORY_CARE_PROVIDER_SITE_OTHER): Admitting: Orthopaedic Surgery

## 2016-06-11 ENCOUNTER — Encounter (INDEPENDENT_AMBULATORY_CARE_PROVIDER_SITE_OTHER): Payer: Self-pay

## 2016-06-11 ENCOUNTER — Telehealth (INDEPENDENT_AMBULATORY_CARE_PROVIDER_SITE_OTHER): Payer: Self-pay | Admitting: Orthopaedic Surgery

## 2016-06-11 ENCOUNTER — Encounter (INDEPENDENT_AMBULATORY_CARE_PROVIDER_SITE_OTHER): Payer: Self-pay | Admitting: Orthopaedic Surgery

## 2016-06-11 DIAGNOSIS — S76302A Unspecified injury of muscle, fascia and tendon of the posterior muscle group at thigh level, left thigh, initial encounter: Secondary | ICD-10-CM

## 2016-06-11 MED ORDER — METHYLPREDNISOLONE ACETATE 40 MG/ML IJ SUSP
40.0000 mg | INTRAMUSCULAR | Status: AC | PRN
  Administered 2016-06-11: 40 mg via INTRA_ARTICULAR

## 2016-06-11 MED ORDER — BUPIVACAINE HCL 0.5 % IJ SOLN
2.0000 mL | INTRAMUSCULAR | Status: AC | PRN
  Administered 2016-06-11: 2 mL via INTRA_ARTICULAR

## 2016-06-11 MED ORDER — LIDOCAINE HCL 1 % IJ SOLN
2.0000 mL | INTRAMUSCULAR | Status: AC | PRN
  Administered 2016-06-11: 2 mL

## 2016-06-11 NOTE — Progress Notes (Signed)
Office Visit Note   Patient: Marie Kramer           Date of Birth: May 17, 1976           MRN: [id number] Visit Date: 06/11/2016              Requested by: No referring provider defined for this encounter. PCP: No PCP Per Patient   Assessment & Plan: Visit Diagnoses:  1. Left hamstring injury, initial encounter     Plan: Impression is left biceps femoris strain versus tendinitis. A cortisone injection was performed today under sterile conditions. reorder physical therapy. Continue light duty.  Follow-Up Instructions: Return if symptoms worsen or fail to improve.   Orders:  No orders of the defined types were placed in this encounter.  No orders of the defined types were placed in this encounter.     Procedures: Large Joint Inj Date/Time: 06/11/2016 11:20 AM Performed by: Tarry KosXU, Hau Sanor M Authorized by: Tarry KosXU, Zyen Triggs M   Consent Given by:  Patient Timeout: prior to procedure the correct patient, procedure, and site was verified   Indications:  Pain Location:  Knee Site:  R knee Prep: patient was prepped and draped in usual sterile fashion   Needle Size:  22 G Ultrasound Guidance: No   Fluoroscopic Guidance: No   Arthrogram: No   Medications:  2 mL lidocaine 1 %; 2 mL bupivacaine 0.5 %; 40 mg methylPREDNISolone acetate 40 MG/ML Patient tolerance:  Patient tolerated the procedure well with no immediate complications     Clinical Data: No additional findings.   Subjective: Chief Complaint  Patient presents with  . Left Knee - Pain, New Patient (Initial Visit)    Patient is a 40 year old female with left knee pain since April resulting from a fall at work. She fell on a slippery surface landing on her left knee. She has been seeing [facility]. She has not had physical therapy due to [employer] denial. Her pain is 7 out of 10 which comes and goes. He endorses burning and swelling mainly in the posterior lateral region of the left  knee. The knee feels like it wants to give out. She is currently doing light duty with rest breaks every 2 hours. She's had a couple of injections with very temporary relief. MRI has also been performed which was essentially negative. X-rays have been negative.    Review of Systems  Constitutional: Negative.   HENT: Negative.   Eyes: Negative.   Respiratory: Negative.   Cardiovascular: Negative.   Endocrine: Negative.   Musculoskeletal: Negative.   Neurological: Negative.   Hematological: Negative.   Psychiatric/Behavioral: Negative.   All other systems reviewed and are negative.    Objective: Vital Signs: There were no vitals taken for this visit.  Physical Exam  Constitutional: She is oriented to person, place, and time. She appears well-developed and well-nourished.  HENT:  Head: Atraumatic.  Eyes: EOM are normal.  Neck: Neck supple.  Cardiovascular: Intact distal pulses.   Pulmonary/Chest: Effort normal.  Abdominal: Soft.  Neurological: She is alert and oriented to person, place, and time.  Skin: Skin is warm. Capillary refill takes less than 2 seconds.  Psychiatric: She has a normal mood and affect. Her behavior is normal. Judgment and thought content normal.  Nursing note and vitals reviewed.   Ortho Exam Exam of the left knee shows no joint effusion. Collaterals and cruciates are stable. She is point tender over the biceps femoris tendon.  She is neurovascularly intact. Specialty Comments:  No specialty comments available.  Imaging: No results found.   PMFS History: Patient Active Problem List   Diagnosis Date Noted  . Left hamstring injury 06/11/2016   Past Medical History:  Diagnosis Date  . Asthma   . Bronchitis     No family history on file.  Past Surgical History:  Procedure Laterality Date  . TUBAL LIGATION     Social History   Occupational History  . Not on file.   Social History Main Topics  . Smoking status: Never Smoker  . Smokeless  tobacco: Never Used  . Alcohol use No  . Drug use: No  . Sexual activity: Yes    Birth control/ protection: None, Surgical

## 2016-06-11 NOTE — Telephone Encounter (Signed)
Note given to pt

## 2016-06-19 ENCOUNTER — Ambulatory Visit (INDEPENDENT_AMBULATORY_CARE_PROVIDER_SITE_OTHER): Admitting: Orthopaedic Surgery

## 2016-06-19 ENCOUNTER — Encounter (INDEPENDENT_AMBULATORY_CARE_PROVIDER_SITE_OTHER): Payer: Self-pay | Admitting: Orthopaedic Surgery

## 2016-06-19 DIAGNOSIS — G8929 Other chronic pain: Secondary | ICD-10-CM | POA: Diagnosis not present

## 2016-06-19 DIAGNOSIS — M25562 Pain in left knee: Secondary | ICD-10-CM | POA: Diagnosis not present

## 2016-06-19 HISTORY — DX: Other chronic pain: G89.29

## 2016-06-19 NOTE — Progress Notes (Signed)
   Office Visit Note   Patient: Marie Kramer           Date of Birth: [birth year]           MRN: [id number] Visit Date: 06/19/2016              Requested by: No referring provider defined for this encounter. PCP: No PCP Per Patient   Assessment & Plan: Visit Diagnoses: No diagnosis found.  Plan: I reviewed the MRI which is essentially an negative MRI study. At this point I think she likely has a strain of her posterior capsule and possibly other posterior knee structures. She did respond temporarily to 2 intra-articular injections. We did discuss possibly diagnostic arthroscopy but I think it's value is likely low. I think she desperately needs physical therapy for this issue. For now order to put her on modified duty permanently. Follow-up as needed.  Follow-Up Instructions: Return if symptoms worsen or fail to improve.   Orders:  No orders of the defined types were placed in this encounter.  No orders of the defined types were placed in this encounter.     Procedures: No procedures performed   Clinical Data: No additional findings.   Subjective: Chief Complaint  Patient presents with  . Left Knee - Pain, Follow-up    Patient follows up today for her knee injury. She had an injection at her previous visit without any relief whatsoever. She still has been denied physical therapy.    Review of Systems   Objective: Vital Signs: There were no vitals taken for this visit.  Physical Exam  Ortho Exam Physical exam is stable. Specialty Comments:  No specialty comments available.  Imaging: No results found.   PMFS History: Patient Active Problem List   Diagnosis Date Noted  . Left hamstring injury 06/11/2016   Past Medical History:  Diagnosis Date  . Asthma   . Bronchitis     No family history on file.  Past Surgical History:  Procedure Laterality Date  . TUBAL LIGATION     Social History   Occupational History  . Not on file.   Social  History Main Topics  . Smoking status: Never Smoker  . Smokeless tobacco: Never Used  . Alcohol use No  . Drug use: No  . Sexual activity: Yes    Birth control/ protection: None, Surgical

## 2016-07-24 IMAGING — CR DG KNEE 1-2V*L*
2 series · 2 of 2 positions shown · non-contrast
Comparison: 01/18/2013

CLINICAL DATA: Fall at work with generalized left knee pain.
Initial encounter.

EXAM:
LEFT KNEE - 1-2 VIEW

[t knee ap left]
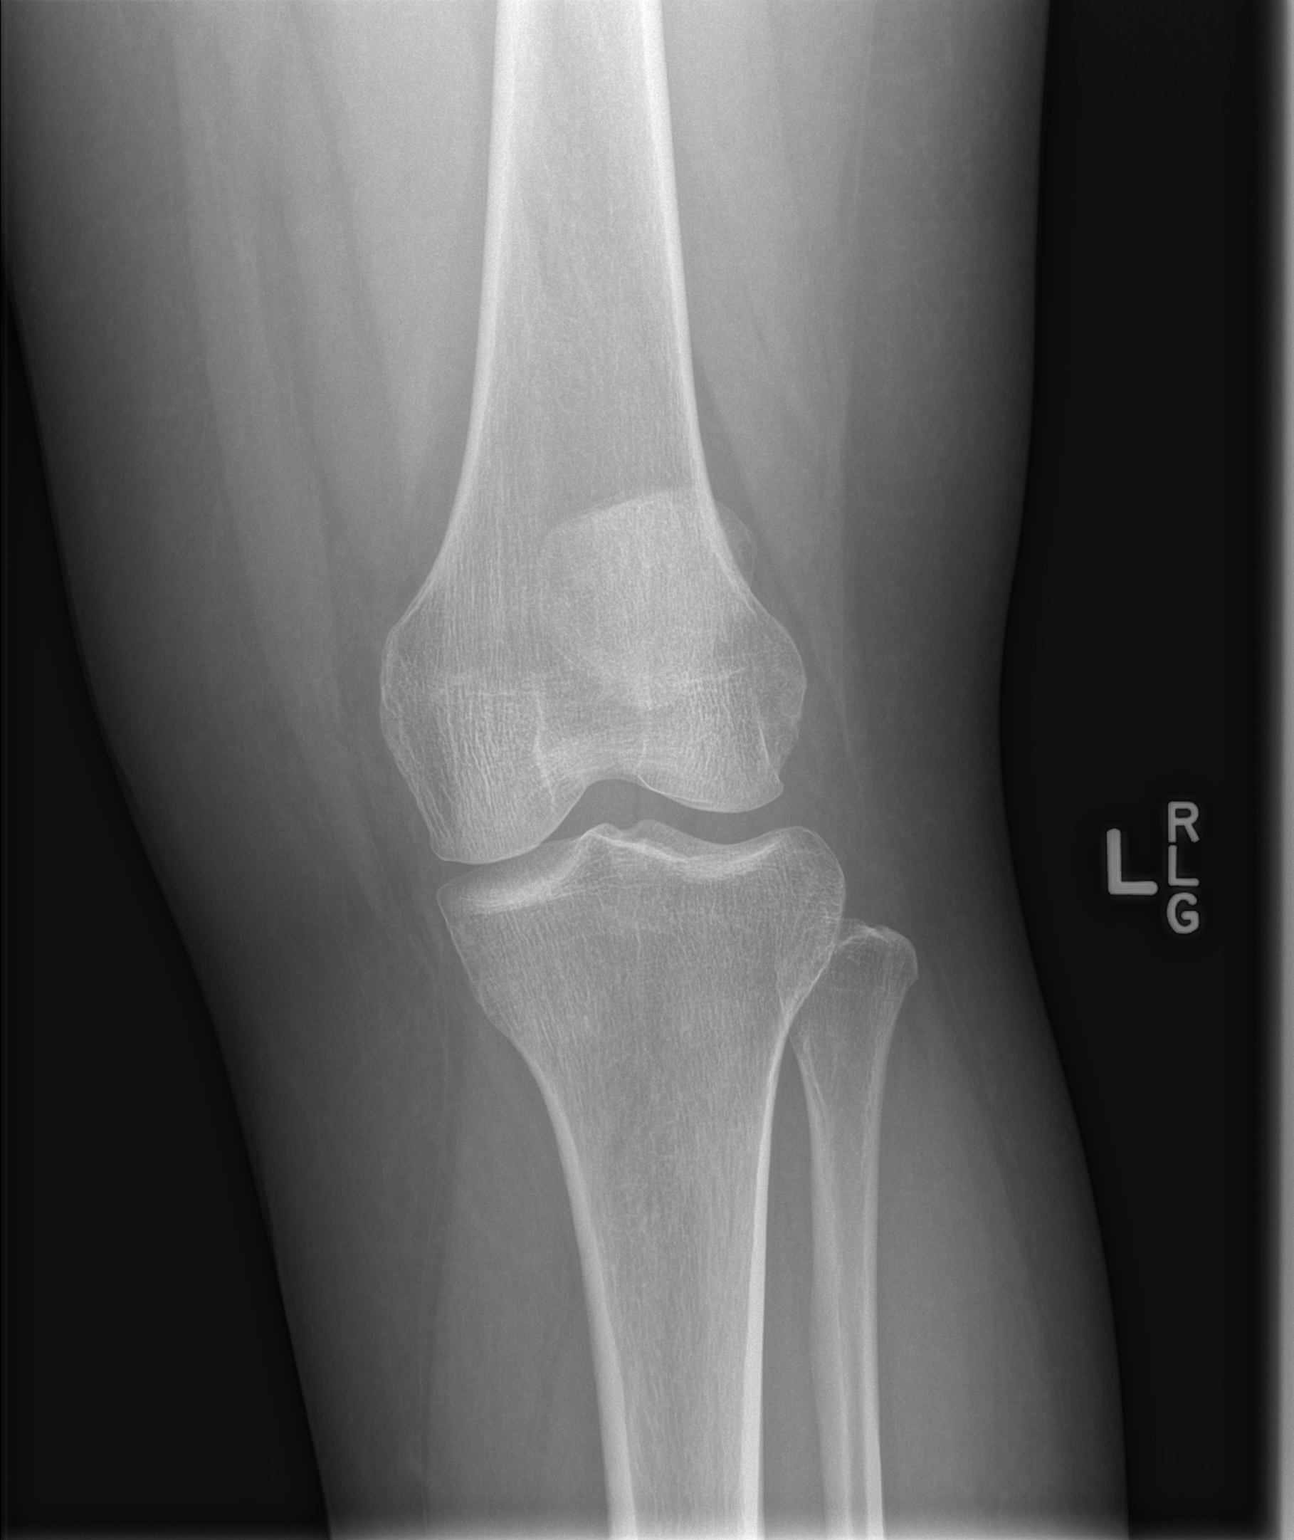

[t knee lat left]
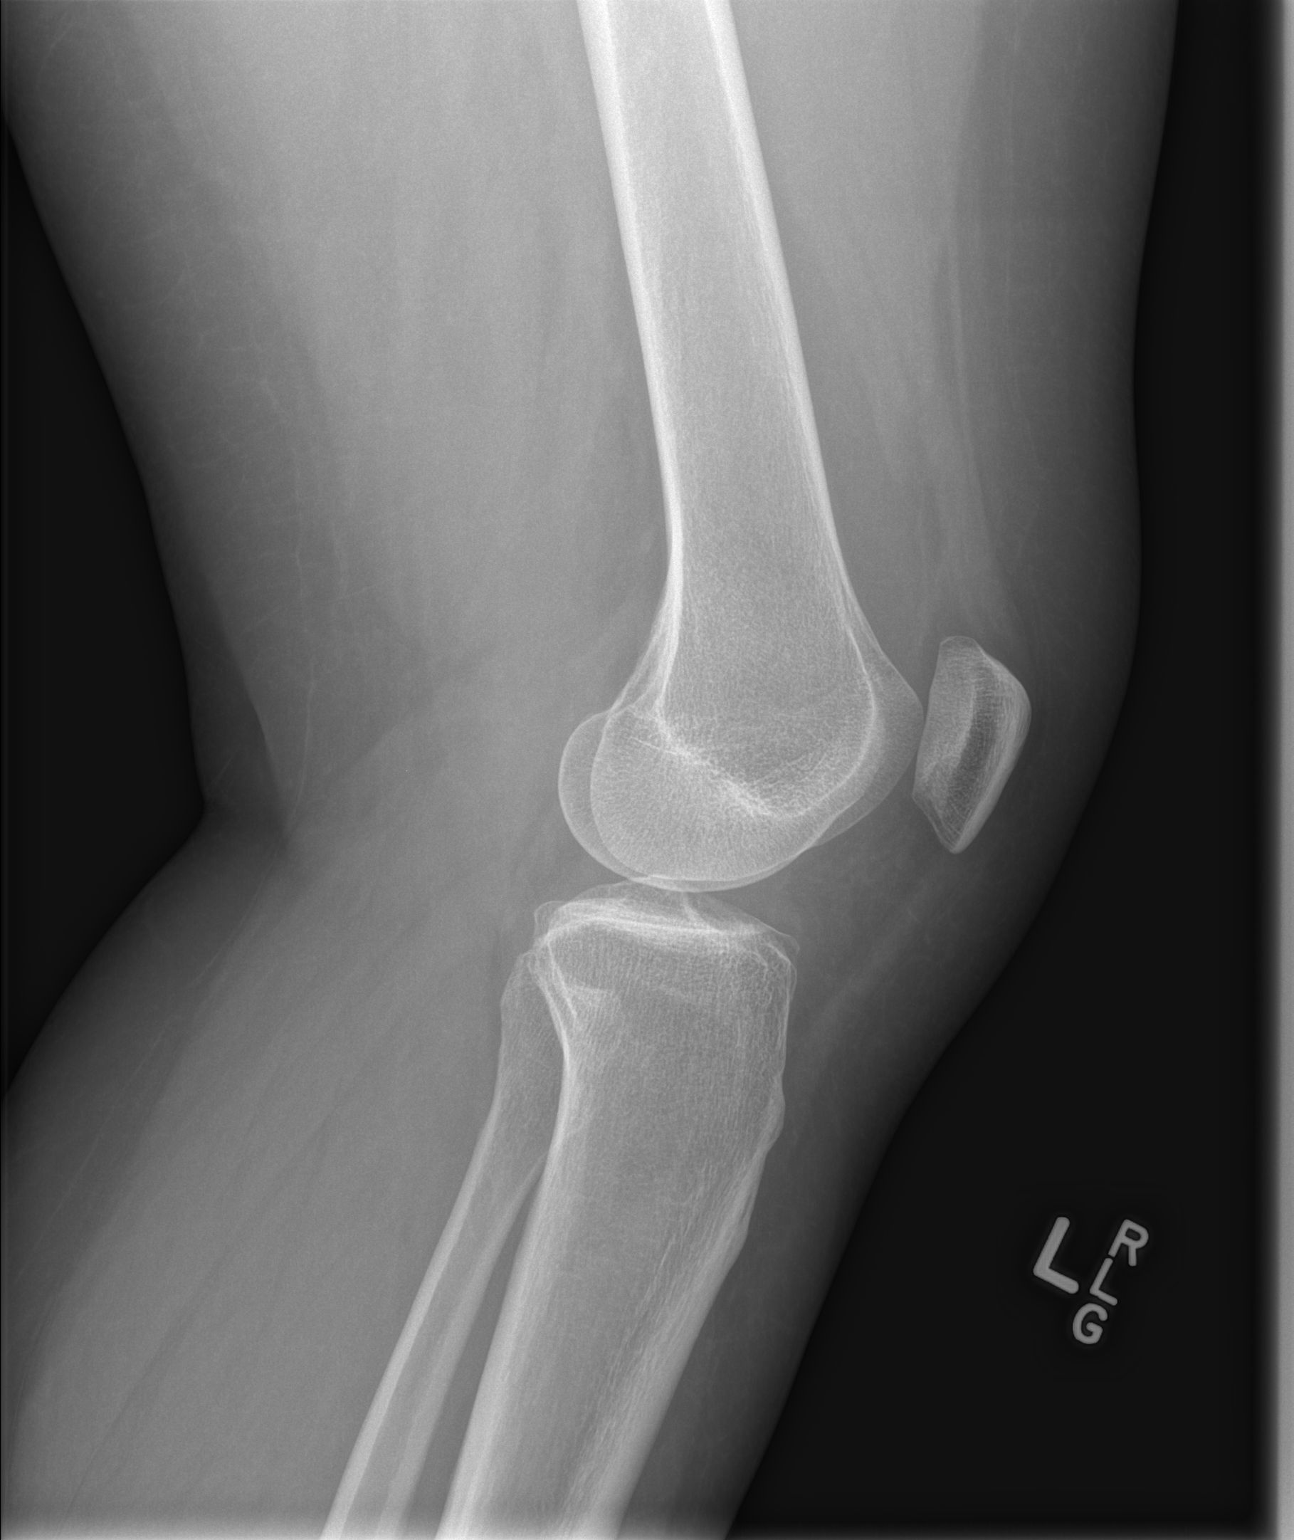

[2 of 2 positions shown; findings below may reference images not displayed]

FINDINGS: There is no evidence of fracture, dislocation, or joint effusion.
There is no evidence of arthropathy or other focal bone abnormality.
Soft tissues are unremarkable.
IMPRESSION: Negative.

## 2016-07-25 DIAGNOSIS — S39012A Strain of muscle, fascia and tendon of lower back, initial encounter: Secondary | ICD-10-CM | POA: Diagnosis not present

## 2016-08-02 DIAGNOSIS — M25462 Effusion, left knee: Secondary | ICD-10-CM | POA: Diagnosis not present

## 2016-08-02 DIAGNOSIS — G8929 Other chronic pain: Secondary | ICD-10-CM | POA: Diagnosis not present

## 2016-08-02 DIAGNOSIS — M25562 Pain in left knee: Secondary | ICD-10-CM | POA: Diagnosis not present

## 2016-08-06 DIAGNOSIS — R42 Dizziness and giddiness: Secondary | ICD-10-CM | POA: Diagnosis not present

## 2016-08-06 DIAGNOSIS — R51 Headache: Secondary | ICD-10-CM | POA: Diagnosis not present

## 2016-08-06 DIAGNOSIS — N3090 Cystitis, unspecified without hematuria: Secondary | ICD-10-CM | POA: Diagnosis not present

## 2016-08-06 DIAGNOSIS — D649 Anemia, unspecified: Secondary | ICD-10-CM | POA: Diagnosis not present

## 2016-08-07 ENCOUNTER — Ambulatory Visit (INDEPENDENT_AMBULATORY_CARE_PROVIDER_SITE_OTHER): Payer: Worker's Compensation | Admitting: Orthopaedic Surgery

## 2016-09-13 DIAGNOSIS — H6123 Impacted cerumen, bilateral: Secondary | ICD-10-CM | POA: Diagnosis not present

## 2016-09-13 DIAGNOSIS — J45909 Unspecified asthma, uncomplicated: Secondary | ICD-10-CM | POA: Diagnosis not present

## 2016-09-13 DIAGNOSIS — D649 Anemia, unspecified: Secondary | ICD-10-CM | POA: Diagnosis not present

## 2016-09-13 DIAGNOSIS — N390 Urinary tract infection, site not specified: Secondary | ICD-10-CM | POA: Diagnosis not present

## 2016-09-13 DIAGNOSIS — Z791 Long term (current) use of non-steroidal anti-inflammatories (NSAID): Secondary | ICD-10-CM | POA: Diagnosis not present

## 2016-09-13 DIAGNOSIS — B9689 Other specified bacterial agents as the cause of diseases classified elsewhere: Secondary | ICD-10-CM | POA: Diagnosis not present

## 2016-09-13 DIAGNOSIS — R0981 Nasal congestion: Secondary | ICD-10-CM | POA: Diagnosis not present

## 2016-09-13 DIAGNOSIS — J111 Influenza due to unidentified influenza virus with other respiratory manifestations: Secondary | ICD-10-CM | POA: Diagnosis not present

## 2016-09-13 DIAGNOSIS — R509 Fever, unspecified: Secondary | ICD-10-CM | POA: Diagnosis not present

## 2016-09-16 ENCOUNTER — Encounter (HOSPITAL_COMMUNITY): Payer: Self-pay

## 2016-09-16 ENCOUNTER — Emergency Department (HOSPITAL_COMMUNITY)
Admission: EM | Admit: 2016-09-16 | Discharge: 2016-09-17 | Disposition: A | Discharge status: Home / Self Care | Payer: Federal, State, Local not specified - PPO | Attending: Emergency Medicine | Admitting: Emergency Medicine

## 2016-09-16 DIAGNOSIS — J45909 Unspecified asthma, uncomplicated: Secondary | ICD-10-CM | POA: Insufficient documentation

## 2016-09-16 DIAGNOSIS — J069 Acute upper respiratory infection, unspecified: Secondary | ICD-10-CM | POA: Insufficient documentation

## 2016-09-16 DIAGNOSIS — Z79899 Other long term (current) drug therapy: Secondary | ICD-10-CM | POA: Diagnosis not present

## 2016-09-16 DIAGNOSIS — R05 Cough: Secondary | ICD-10-CM | POA: Diagnosis not present

## 2016-09-16 DIAGNOSIS — B9789 Other viral agents as the cause of diseases classified elsewhere: Secondary | ICD-10-CM

## 2016-09-16 NOTE — ED Triage Notes (Addendum)
Pt states that she has had body aches, chills, congestion and cough starting yesterday. A&Ox4. Ambulatory.

## 2016-09-17 LAB — RAPID STREP SCREEN (MED CTR MEBANE ONLY): STREPTOCOCCUS, GROUP A SCREEN (DIRECT): NEGATIVE

## 2016-09-17 MED ORDER — BENZONATATE 100 MG PO CAPS: 100.0000 mg | ORAL_CAPSULE | Freq: Three times a day (TID) | ORAL | 0 refills | Status: DC

## 2016-09-17 MED ORDER — IBUPROFEN 800 MG PO TABS: 800.0000 mg | ORAL_TABLET | Freq: Three times a day (TID) | ORAL | 0 refills | Status: DC

## 2016-09-17 MED ORDER — ONDANSETRON 4 MG PO TBDP: 4.0000 mg | ORAL_TABLET | Freq: Three times a day (TID) | ORAL | 0 refills | Status: DC | PRN

## 2016-09-17 NOTE — Discharge Instructions (Signed)
Take the prescribed medication as directed.  Make sure to rest and drink fluids. Follow-up with your primary care doctor. Return to the ED for new or worsening symptoms. 

## 2016-09-17 NOTE — ED Provider Notes (Signed)
[facility]-EMERGENCY DEPT Provider Note   CSN: [id number] Arrival date & time: 09/16/16  2320     History   Chief Complaint Chief Complaint  Patient presents with  . Generalized Body Aches  . Nasal Congestion    HPI Shaune Spittleajuana K Mensinger is a 41 y.o. female.  The history is provided by the patient and medical records.    41 year old female with history of asthma and recurrent bronchitis, presenting to the ED for dry cough, nasal congestion, sore throat, and body aches which began yesterday. Reports she feels fatigued and tired. She denies any chest pain or shortness of breath. Has had some nausea but denies vomiting or diarrhea. Has been able to eat and drink normally. Denies any sick contacts. No flu vaccination this year. She's not tried any medications for her symptoms.  Past Medical History:  Diagnosis Date  . Asthma   . Bronchitis     Patient Active Problem List   Diagnosis Date Noted  . Chronic pain of left knee 06/19/2016  . Left hamstring injury 06/11/2016    Past Surgical History:  Procedure Laterality Date  . TUBAL LIGATION      OB History    No data available       Home Medications    Prior to Admission medications   Medication Sig Start Date End Date Taking? Authorizing Provider  ibuprofen (ADVIL,MOTRIN) 800 MG tablet Take 1 tablet (800 mg total) by mouth every 8 (eight) hours as needed. 04/25/16   Tobey GrimJeffrey H Walden, MD    Family History History reviewed. No pertinent family history.  Social History Social History  Substance Use Topics  . Smoking status: Never Smoker  . Smokeless tobacco: Never Used  . Alcohol use No     Allergies   Patient has no known allergies.   Review of Systems Review of Systems  HENT: Positive for congestion and sore throat.   Respiratory: Positive for cough.   Musculoskeletal: Positive for myalgias.  All other systems reviewed and are negative.    Physical Exam Updated Vital Signs BP 117/79 (BP Location: Left  Arm)   Pulse 88   Temp 99.5 F (37.5 C) (Oral)   Resp 18   SpO2 100%   Physical Exam  Constitutional: She is oriented to person, place, and time. She appears well-developed and well-nourished.  Overall appears well, NAD  HENT:  Head: Normocephalic and atraumatic.  Right Ear: Tympanic membrane and ear canal normal.  Left Ear: Tympanic membrane and ear canal normal.  Nose: Mucosal edema and rhinorrhea present.  Mouth/Throat: Uvula is midline, oropharynx is clear and moist and mucous membranes are normal.  + nasal congestion with PND Tonsils overall normal in appearance bilaterally without exudate; uvula midline without evidence of peritonsillar abscess; handling secretions appropriately; no difficulty swallowing or speaking; normal phonation without stridor  Eyes: Conjunctivae and EOM are normal. Pupils are equal, round, and reactive to light.  Neck: Normal range of motion.  Cardiovascular: Normal rate, regular rhythm and normal heart sounds.   Pulmonary/Chest: Effort normal and breath sounds normal. She has no wheezes. She has no rhonchi. She has no rales.  No wheezes or rhonchi, no distress, able to speak in full sentences without difficulty  Abdominal: Soft. Bowel sounds are normal.  Musculoskeletal: Normal range of motion.  Neurological: She is alert and oriented to person, place, and time.  Skin: Skin is warm and dry.  Psychiatric: She has a normal mood and affect.  Nursing note and vitals reviewed.  ED Treatments / Results  Labs (all labs ordered are listed, but only abnormal results are displayed) Labs Reviewed  RAPID STREP SCREEN (NOT AT [facility])  CULTURE, GROUP A STREP [facility])    EKG  EKG Interpretation None       Radiology No results found.  Procedures Procedures (including critical care time)  Medications Ordered in ED Medications - No data to display   Initial Impression / Assessment and Plan / ED Course  I have reviewed the triage vital signs and the  nursing notes.  Pertinent labs & imaging results that were available during my care of the patient were reviewed by me and considered in my medical decision making (see chart for details).  41 year old female here with URI type symptoms which began yesterday. She is afebrile and nontoxic. Her exam is overall benign aside from some nasal congestion and postnasal drip.  Her lungs are clear without wheezes or rhonchi to suggest pneumonia. Strep was sent and is negative, culture pending. Suspect viral process. We discharged home with supportive care. Recommended rest and oral hydration. Work note provided.  Discussed plan with patient, she acknowledged understanding and agreed with plan of care.  Return precautions given for new or worsening symptoms.  Final Clinical Impressions(s) / ED Diagnoses   Final diagnoses:  Viral URI with cough    New Prescriptions New Prescriptions   BENZONATATE (TESSALON) 100 MG CAPSULE    Take 1 capsule (100 mg total) by mouth every 8 (eight) hours.   IBUPROFEN (ADVIL,MOTRIN) 800 MG TABLET    Take 1 tablet (800 mg total) by mouth 3 (three) times daily.   ONDANSETRON (ZOFRAN ODT) 4 MG DISINTEGRATING TABLET    Take 1 tablet (4 mg total) by mouth every 8 (eight) hours as needed for nausea.     Garlon Hatchet, PA-C 09/17/16 0026    Nira Conn, MD 09/17/16 [phone]

## 2016-09-19 LAB — CULTURE, GROUP A STREP (THRC)

## 2016-10-16 DIAGNOSIS — R071 Chest pain on breathing: Secondary | ICD-10-CM | POA: Diagnosis not present

## 2016-10-16 DIAGNOSIS — J209 Acute bronchitis, unspecified: Secondary | ICD-10-CM | POA: Diagnosis not present

## 2016-10-16 DIAGNOSIS — J45909 Unspecified asthma, uncomplicated: Secondary | ICD-10-CM | POA: Diagnosis not present

## 2016-10-16 DIAGNOSIS — J3489 Other specified disorders of nose and nasal sinuses: Secondary | ICD-10-CM | POA: Diagnosis not present

## 2016-10-16 DIAGNOSIS — R05 Cough: Secondary | ICD-10-CM | POA: Diagnosis not present

## 2016-10-16 DIAGNOSIS — J040 Acute laryngitis: Secondary | ICD-10-CM | POA: Diagnosis not present

## 2016-10-17 DIAGNOSIS — R05 Cough: Secondary | ICD-10-CM | POA: Diagnosis not present

## 2016-11-30 DIAGNOSIS — J45909 Unspecified asthma, uncomplicated: Secondary | ICD-10-CM | POA: Diagnosis not present

## 2016-11-30 DIAGNOSIS — M545 Low back pain: Secondary | ICD-10-CM | POA: Diagnosis not present

## 2016-11-30 DIAGNOSIS — S39012A Strain of muscle, fascia and tendon of lower back, initial encounter: Secondary | ICD-10-CM | POA: Diagnosis not present

## 2016-11-30 DIAGNOSIS — X500XXA Overexertion from strenuous movement or load, initial encounter: Secondary | ICD-10-CM | POA: Diagnosis not present

## 2016-11-30 DIAGNOSIS — M419 Scoliosis, unspecified: Secondary | ICD-10-CM | POA: Diagnosis not present

## 2016-12-01 DIAGNOSIS — D649 Anemia, unspecified: Secondary | ICD-10-CM | POA: Diagnosis not present

## 2016-12-01 DIAGNOSIS — R1111 Vomiting without nausea: Secondary | ICD-10-CM | POA: Diagnosis not present

## 2016-12-01 DIAGNOSIS — N3000 Acute cystitis without hematuria: Secondary | ICD-10-CM | POA: Diagnosis not present

## 2016-12-01 DIAGNOSIS — R112 Nausea with vomiting, unspecified: Secondary | ICD-10-CM | POA: Diagnosis not present

## 2016-12-01 DIAGNOSIS — N3090 Cystitis, unspecified without hematuria: Secondary | ICD-10-CM | POA: Diagnosis not present

## 2016-12-01 DIAGNOSIS — M549 Dorsalgia, unspecified: Secondary | ICD-10-CM | POA: Diagnosis not present

## 2016-12-01 DIAGNOSIS — K5900 Constipation, unspecified: Secondary | ICD-10-CM | POA: Diagnosis not present

## 2016-12-01 DIAGNOSIS — J45909 Unspecified asthma, uncomplicated: Secondary | ICD-10-CM | POA: Diagnosis not present

## 2016-12-13 ENCOUNTER — Other Ambulatory Visit (INDEPENDENT_AMBULATORY_CARE_PROVIDER_SITE_OTHER): Payer: Self-pay

## 2016-12-13 ENCOUNTER — Ambulatory Visit (INDEPENDENT_AMBULATORY_CARE_PROVIDER_SITE_OTHER): Admitting: Orthopaedic Surgery

## 2016-12-13 DIAGNOSIS — M25562 Pain in left knee: Secondary | ICD-10-CM | POA: Diagnosis not present

## 2016-12-13 DIAGNOSIS — G8929 Other chronic pain: Secondary | ICD-10-CM

## 2016-12-13 NOTE — Progress Notes (Signed)
   Office Visit Note   Patient: Marie Kramer           Date of Birth: 06-Jul-1975           MRN: [id number] Visit Date: 12/13/2016              Requested by: No referring provider defined for this encounter. PCP: Patient, No Pcp Per   Assessment & Plan: Visit Diagnoses:  1. Chronic pain of left knee     Plan: Patient has failed conservative treatment. She did get partial relief to previous chart articular steroid injections. I'll like to get an updated MRI of her left knee to rule out structural maladies. Follow-up after the MRI.  Follow-Up Instructions: Return in about 2 weeks (around 12/27/2016).   Orders:  No orders of the defined types were placed in this encounter.  No orders of the defined types were placed in this encounter.     Procedures: No procedures performed   Clinical Data: No additional findings.   Subjective: No chief complaint on file.   Patient returns today for continued left knee pain. I first saw her in December for this. She states not gotten any better. She continues to have pain throughout the knee. She's endorsing popping and giving way and swelling.    Review of Systems  Constitutional: Negative.   HENT: Negative.   Eyes: Negative.   Respiratory: Negative.   Cardiovascular: Negative.   Endocrine: Negative.   Musculoskeletal: Negative.   Neurological: Negative.   Hematological: Negative.   Psychiatric/Behavioral: Negative.   All other systems reviewed and are negative.    Objective: Vital Signs: There were no vitals taken for this visit.  Physical Exam  Constitutional: She is oriented to person, place, and time. She appears well-developed and well-nourished.  Pulmonary/Chest: Effort normal.  Neurological: She is alert and oriented to person, place, and time.  Skin: Skin is warm. Capillary refill takes less than 2 seconds.  Psychiatric: She has a normal mood and affect. Her behavior is normal. Judgment and thought content  normal.  Nursing note and vitals reviewed.   Ortho Exam Left knee exam shows no joint effusion. She is very jumpy with any sort of palpation. She also jumps with range of motion. Specialty Comments:  No specialty comments available.  Imaging: No results found.   PMFS History: Patient Active Problem List   Diagnosis Date Noted  . Chronic pain of left knee 06/19/2016  . Left hamstring injury 06/11/2016   Past Medical History:  Diagnosis Date  . Asthma   . Bronchitis     No family history on file.  Past Surgical History:  Procedure Laterality Date  . TUBAL LIGATION     Social History   Occupational History  . Not on file.   Social History Main Topics  . Smoking status: Never Smoker  . Smokeless tobacco: Never Used  . Alcohol use No  . Drug use: No  . Sexual activity: Yes    Birth control/ protection: None, Surgical

## 2016-12-27 ENCOUNTER — Ambulatory Visit (INDEPENDENT_AMBULATORY_CARE_PROVIDER_SITE_OTHER): Admitting: Orthopaedic Surgery

## 2016-12-27 ENCOUNTER — Ambulatory Visit
Admission: RE | Admit: 2016-12-27 | Discharge: 2016-12-27 | Disposition: A | Discharge status: Home / Self Care | Payer: Federal, State, Local not specified - PPO | Source: Ambulatory Visit | Attending: Orthopaedic Surgery | Admitting: Orthopaedic Surgery

## 2016-12-27 DIAGNOSIS — M25562 Pain in left knee: Secondary | ICD-10-CM

## 2016-12-27 DIAGNOSIS — M1711 Unilateral primary osteoarthritis, right knee: Secondary | ICD-10-CM | POA: Diagnosis not present

## 2016-12-27 DIAGNOSIS — G8929 Other chronic pain: Secondary | ICD-10-CM

## 2016-12-27 NOTE — Progress Notes (Signed)
reschedule

## 2017-01-01 ENCOUNTER — Encounter (INDEPENDENT_AMBULATORY_CARE_PROVIDER_SITE_OTHER): Payer: Self-pay | Admitting: Orthopaedic Surgery

## 2017-01-01 ENCOUNTER — Ambulatory Visit (INDEPENDENT_AMBULATORY_CARE_PROVIDER_SITE_OTHER): Admitting: Orthopaedic Surgery

## 2017-01-01 DIAGNOSIS — M25562 Pain in left knee: Secondary | ICD-10-CM | POA: Diagnosis not present

## 2017-01-01 DIAGNOSIS — M7122 Synovial cyst of popliteal space [Baker], left knee: Secondary | ICD-10-CM | POA: Insufficient documentation

## 2017-01-01 NOTE — Progress Notes (Signed)
Office Visit Note   Patient: Marie Kramer           Date of Birth: 03-18-1976           MRN: [id number] Visit Date: 01/01/2017              Requested by: No referring provider defined for this encounter. PCP: Patient, No Pcp Per   Assessment & Plan: Visit Diagnoses:  1. Synovial cyst of left knee     Plan: MRI shows a synovial cyst in the anterior aspect of the femoral notch. She also has tricompartmental degenerative changes that are mild. She also has a undersurface tear of the medial meniscus. Given her MRI findings I think her pain is consistent with the ganglion cyst and tricompartmental degenerative changes. She understands that the cyst is something that can be reliably surgically treated but her arthritis is not going to be reliably treated with arthroscopy. She understands this and wishes to proceed with arthroscopic excision of the cyst and synovectomy. We will evaluate the medial meniscus intraoperatively by doubt that this will need any debridement. We discussed risks benefits alternatives to surgery she understands wished proceed. She denies any history of DVT. Total face to face encounter time was greater than 25 minutes and over half of this time was spent in counseling and/or coordination of care.  Follow-Up Instructions: Return if symptoms worsen or fail to improve.   Orders:  No orders of the defined types were placed in this encounter.  No orders of the defined types were placed in this encounter.     Procedures: No procedures performed   Clinical Data: No additional findings.   Subjective: Chief Complaint  Patient presents with  . Left Knee - Follow-up    MRI Review    Patient follows up today for review her MRI. He is to have the same symptoms in her knee.    Review of Systems  Constitutional: Negative.   HENT: Negative.   Eyes: Negative.   Respiratory: Negative.   Cardiovascular: Negative.   Endocrine: Negative.   Musculoskeletal:  Negative.   Neurological: Negative.   Hematological: Negative.   Psychiatric/Behavioral: Negative.   All other systems reviewed and are negative.    Objective: Vital Signs: There were no vitals taken for this visit.  Physical Exam  Constitutional: She is oriented to person, place, and time. She appears well-developed and well-nourished.  Pulmonary/Chest: Effort normal.  Neurological: She is alert and oriented to person, place, and time.  Skin: Skin is warm. Capillary refill takes less than 2 seconds.  Psychiatric: She has a normal mood and affect. Her behavior is normal. Judgment and thought content normal.  Nursing note and vitals reviewed.   Ortho Exam Left knee exam shows no joint effusion. Exam is stable. Specialty Comments:  No specialty comments available.  Imaging: No results found.   PMFS History: Patient Active Problem List   Diagnosis Date Noted  . Synovial cyst of left knee 01/01/2017  . Chronic pain of left knee 06/19/2016  . Left hamstring injury 06/11/2016   Past Medical History:  Diagnosis Date  . Asthma   . Bronchitis     No family history on file.  Past Surgical History:  Procedure Laterality Date  . TUBAL LIGATION     Social History   Occupational History  . Not on file.   Social History Main Topics  . Smoking status: Never Smoker  . Smokeless tobacco: Never Used  . Alcohol use No  .  Drug use: No  . Sexual activity: Yes    Birth control/ protection: None, Surgical

## 2017-02-05 DIAGNOSIS — F419 Anxiety disorder, unspecified: Secondary | ICD-10-CM | POA: Diagnosis not present

## 2017-02-05 DIAGNOSIS — R1013 Epigastric pain: Secondary | ICD-10-CM | POA: Diagnosis not present

## 2017-02-05 DIAGNOSIS — R0789 Other chest pain: Secondary | ICD-10-CM | POA: Diagnosis not present

## 2017-02-05 DIAGNOSIS — R002 Palpitations: Secondary | ICD-10-CM | POA: Diagnosis not present

## 2017-02-25 ENCOUNTER — Emergency Department (HOSPITAL_COMMUNITY)
Admission: EM | Admit: 2017-02-25 | Discharge: 2017-02-25 | Discharge status: Against Medical Advice | Payer: Federal, State, Local not specified - PPO | Attending: Emergency Medicine | Admitting: Emergency Medicine

## 2017-02-25 ENCOUNTER — Encounter (HOSPITAL_COMMUNITY): Payer: Self-pay | Admitting: Emergency Medicine

## 2017-02-25 ENCOUNTER — Encounter: Payer: Self-pay | Admitting: Physician Assistant

## 2017-02-25 ENCOUNTER — Ambulatory Visit (INDEPENDENT_AMBULATORY_CARE_PROVIDER_SITE_OTHER): Payer: Federal, State, Local not specified - PPO | Admitting: Physician Assistant

## 2017-02-25 VITALS — BP 91/57 | HR 58 | Temp 97.9°F | Resp 16 | Ht 65.25 in | Wt 180.6 lb

## 2017-02-25 DIAGNOSIS — Z5321 Procedure and treatment not carried out due to patient leaving prior to being seen by health care provider: Secondary | ICD-10-CM | POA: Insufficient documentation

## 2017-02-25 DIAGNOSIS — H60391 Other infective otitis externa, right ear: Secondary | ICD-10-CM

## 2017-02-25 MED ORDER — NEOMYCIN-POLYMYXIN-HC 3.5-10000-1 OT SOLN: 4.0000 [drp] | Freq: Four times a day (QID) | OTIC | 0 refills | Status: DC

## 2017-02-25 MED ORDER — NAPROXEN 500 MG PO TABS: 500.0000 mg | ORAL_TABLET | Freq: Two times a day (BID) | ORAL | 0 refills | Status: DC

## 2017-02-25 NOTE — Progress Notes (Signed)
02/25/2017 5:11 PM   DOB: 1975/10/06 / MRN: [id number]  SUBJECTIVE:  Marie Kramer is a 41 y.o. female presenting for right sided ear pain that started roughly 5 days ago and is worsening.  Tells me there is tenderness to the touch about the ear.  Feels she is getting worse. Has tried Ibuprofen without relief of pain.  No change in hearing or dizziness. No fever.  Has been swimming recently.   She has No Known Allergies.   She  has a past medical history of Asthma and Bronchitis.    She  reports that she has never smoked. She has never used smokeless tobacco. She reports that she does not drink alcohol or use drugs. She  reports that she currently engages in sexual activity. She reports using the following methods of birth control/protection: None and Surgical. The patient  has a past surgical history that includes Tubal ligation.  Her family history is not on file.  Review of Systems  Constitutional: Negative for chills, diaphoresis and fever.  Respiratory: Negative for cough, hemoptysis, sputum production, shortness of breath and wheezing.   Cardiovascular: Negative for chest pain, orthopnea and leg swelling.  Gastrointestinal: Negative for nausea.  Skin: Negative for rash.  Neurological: Negative for dizziness.    The problem list and medications were reviewed and updated by myself where necessary and exist elsewhere in the encounter.   OBJECTIVE:  BP (!) 91/57   Pulse (!) 58   Temp 97.9 F (36.6 C) (Oral)   Resp 16   Ht 5' 5.25" (1.657 m)   Wt 180 lb 9.6 oz (81.9 kg)   LMP 02/18/2017   SpO2 97%   BMI 29.82 kg/m   Physical Exam  Constitutional: She is active.  Non-toxic appearance.  HENT:  Right Ear: Hearing, tympanic membrane, external ear and ear canal normal.  Left Ear: Hearing, tympanic membrane, external ear and ear canal normal.  Ears:  Nose: Nose normal. Right sinus exhibits no maxillary sinus tenderness and no frontal sinus tenderness. Left sinus  exhibits no maxillary sinus tenderness and no frontal sinus tenderness.  Mouth/Throat: Uvula is midline, oropharynx is clear and moist and mucous membranes are normal. Mucous membranes are not dry. No oropharyngeal exudate, posterior oropharyngeal edema or tonsillar abscesses.  Cardiovascular: Normal rate.   Pulmonary/Chest: Effort normal. No tachypnea.  Lymphadenopathy:       Head (right side): No submandibular and no tonsillar adenopathy present.       Head (left side): No submandibular and no tonsillar adenopathy present.    She has no cervical adenopathy.  Neurological: She is alert.  Skin: Skin is warm and dry. She is not diaphoretic. No pallor.    No results found for this or any previous visit (from the past 72 hour(s)).  No results found.  ASSESSMENT AND PLAN:  Sybel was seen today for ear pain.  Diagnoses and all orders for this visit:  Infective otitis externa of right ear -     neomycin-polymyxin-hydrocortisone (CORTISPORIN) OTIC solution; Place 4 drops into the right ear 4 (four) times daily. -     naproxen (NAPROSYN) 500 MG tablet; Take 1 tablet (500 mg total) by mouth 2 (two) times daily with a meal.    The patient is advised to call or return to clinic if she does not see an improvement in symptoms, or to seek the care of the closest emergency department if she worsens with the above plan.   Deliah Boston, MHS, PA-C  Primary [facility] at [facility] 02/25/2017 5:11 PM

## 2017-02-25 NOTE — ED Notes (Signed)
Pt c/o pain in the right ear onset Wednesday. No relief from naproxen.

## 2017-02-25 NOTE — Patient Instructions (Signed)
     IF you received an x-ray today, you will receive an invoice from [facility]. Please contact [facility] at [phone] with questions or concerns regarding your invoice.   IF you received labwork today, you will receive an invoice from [facility]. Please contact [facility] at [phone] with questions or concerns regarding your invoice.   Our billing staff will not be able to assist you with questions regarding bills from these companies.  You will be contacted with the lab results as soon as they are available. The fastest way to get your results is to activate your My Chart account. Instructions are located on the last page of this paperwork. If you have not heard from us regarding the results in 2 weeks, please contact this office.     

## 2017-02-25 NOTE — ED Triage Notes (Signed)
Pt reports having pain in posterior right ear that has been ongoing since 02/20/17. Pt reports pain radiates from ear down neck.

## 2017-02-25 NOTE — ED Notes (Signed)
Pt left department and gave labels to EDT

## 2017-04-09 ENCOUNTER — Encounter (INDEPENDENT_AMBULATORY_CARE_PROVIDER_SITE_OTHER): Payer: Self-pay | Admitting: Orthopaedic Surgery

## 2017-04-09 ENCOUNTER — Ambulatory Visit (INDEPENDENT_AMBULATORY_CARE_PROVIDER_SITE_OTHER): Admitting: Orthopaedic Surgery

## 2017-04-09 DIAGNOSIS — M25562 Pain in left knee: Secondary | ICD-10-CM | POA: Diagnosis not present

## 2017-04-09 DIAGNOSIS — M7122 Synovial cyst of popliteal space [Baker], left knee: Secondary | ICD-10-CM

## 2017-04-09 DIAGNOSIS — G8929 Other chronic pain: Secondary | ICD-10-CM

## 2017-04-09 NOTE — Progress Notes (Signed)
Office Visit Note   Patient: Marie Kramer           Date of Birth: 1975-07-19           MRN: [id number] Visit Date: 04/09/2017              Requested by: No referring provider defined for this encounter. PCP: Patient, No Pcp Per   Assessment & Plan: Visit Diagnoses:  1. Synovial cyst of left knee   2. Chronic pain of left knee     Plan: MRI findings consistent with mild osteoarthritis as well as a large synovial cyst in the anterior aspect of the knee. The medial meniscus tear does not appear to be too bad that would make me feel like it would need arthroscopic debridement. We again discussed arthroscopic surgery to decompress the synovial cyst.  We submitted a request for viscosupplementation injection.  Follow-up for the injection once it has been approved. Total face to face encounter time was greater than 25 minutes and over half of this time was spent in counseling and/or coordination of care.  Follow-Up Instructions: Return if symptoms worsen or fail to improve.   Orders:  No orders of the defined types were placed in this encounter.  No orders of the defined types were placed in this encounter.     Procedures: No procedures performed   Clinical Data: No additional findings.   Subjective: Chief Complaint  Patient presents with  . Left Knee - Pain    Patient is a 41 year old female who comes back for continued left knee pain. We are still awaiting worker's comp approval on her knee arthroscopy surgery. She is currently back at work. The cortisone injection did not give her any significant relief.    Review of Systems  Constitutional: Negative.   HENT: Negative.   Eyes: Negative.   Respiratory: Negative.   Cardiovascular: Negative.   Endocrine: Negative.   Musculoskeletal: Negative.   Neurological: Negative.   Hematological: Negative.   Psychiatric/Behavioral: Negative.   All other systems reviewed and are negative.    Objective: Vital Signs:  There were no vitals taken for this visit.  Physical Exam  Constitutional: She is oriented to person, place, and time. She appears well-developed and well-nourished.  Pulmonary/Chest: Effort normal.  Neurological: She is alert and oriented to person, place, and time.  Skin: Skin is warm. Capillary refill takes less than 2 seconds.  Psychiatric: She has a normal mood and affect. Her behavior is normal. Judgment and thought content normal.  Nursing note and vitals reviewed.   Ortho Exam Left knee exam shows no joint effusion. She has generalized pain that radiates to the back of the knee. Specialty Comments:  No specialty comments available.  Imaging: No results found.   PMFS History: Patient Active Problem List   Diagnosis Date Noted  . Synovial cyst of left knee 01/01/2017  . Chronic pain of left knee 06/19/2016  . Left hamstring injury 06/11/2016   Past Medical History:  Diagnosis Date  . Asthma   . Bronchitis     No family history on file.  Past Surgical History:  Procedure Laterality Date  . TUBAL LIGATION     Social History   Occupational History  . Not on file.   Social History Main Topics  . Smoking status: Never Smoker  . Smokeless tobacco: Never Used  . Alcohol use No  . Drug use: No  . Sexual activity: Yes    Birth control/ protection: None,  Surgical

## 2017-04-11 DIAGNOSIS — S93402A Sprain of unspecified ligament of left ankle, initial encounter: Secondary | ICD-10-CM | POA: Diagnosis not present

## 2017-04-11 DIAGNOSIS — X500XXA Overexertion from strenuous movement or load, initial encounter: Secondary | ICD-10-CM | POA: Diagnosis not present

## 2017-04-11 DIAGNOSIS — X501XXA Overexertion from prolonged static or awkward postures, initial encounter: Secondary | ICD-10-CM | POA: Diagnosis not present

## 2017-04-11 DIAGNOSIS — R2242 Localized swelling, mass and lump, left lower limb: Secondary | ICD-10-CM | POA: Diagnosis not present

## 2017-04-11 DIAGNOSIS — M25572 Pain in left ankle and joints of left foot: Secondary | ICD-10-CM | POA: Diagnosis not present

## 2017-04-11 DIAGNOSIS — Y990 Civilian activity done for income or pay: Secondary | ICD-10-CM | POA: Diagnosis not present

## 2017-05-22 ENCOUNTER — Telehealth (INDEPENDENT_AMBULATORY_CARE_PROVIDER_SITE_OTHER): Payer: Self-pay

## 2017-05-22 NOTE — Telephone Encounter (Signed)
Called patient to follow up on Synvisc injection. Does she want to still get inj or not?

## 2017-07-12 ENCOUNTER — Encounter (HOSPITAL_BASED_OUTPATIENT_CLINIC_OR_DEPARTMENT_OTHER): Payer: Self-pay | Admitting: *Deleted

## 2017-07-12 ENCOUNTER — Emergency Department (HOSPITAL_BASED_OUTPATIENT_CLINIC_OR_DEPARTMENT_OTHER)
Admission: EM | Admit: 2017-07-12 | Discharge: 2017-07-12 | Disposition: A | Discharge status: Home / Self Care | Payer: Federal, State, Local not specified - PPO | Attending: Emergency Medicine | Admitting: Emergency Medicine

## 2017-07-12 ENCOUNTER — Other Ambulatory Visit: Payer: Self-pay

## 2017-07-12 DIAGNOSIS — M5442 Lumbago with sciatica, left side: Secondary | ICD-10-CM | POA: Diagnosis not present

## 2017-07-12 DIAGNOSIS — M545 Low back pain: Secondary | ICD-10-CM | POA: Diagnosis not present

## 2017-07-12 DIAGNOSIS — M5432 Sciatica, left side: Secondary | ICD-10-CM | POA: Diagnosis not present

## 2017-07-12 DIAGNOSIS — J45909 Unspecified asthma, uncomplicated: Secondary | ICD-10-CM | POA: Diagnosis not present

## 2017-07-12 LAB — URINALYSIS, ROUTINE W REFLEX MICROSCOPIC
BILIRUBIN URINE: NEGATIVE
GLUCOSE, UA: NEGATIVE mg/dL
HGB URINE DIPSTICK: NEGATIVE
Ketones, ur: NEGATIVE mg/dL
Leukocytes, UA: NEGATIVE
Nitrite: NEGATIVE
PROTEIN: NEGATIVE mg/dL
Specific Gravity, Urine: >1.03 — ABNORMAL HIGH (ref 1.005–1.030)
pH: 6 (ref 5.0–8.0)

## 2017-07-12 LAB — PREGNANCY, URINE: PREG TEST UR: NEGATIVE

## 2017-07-12 MED ORDER — NAPROXEN 375 MG PO TABS: ORAL_TABLET | ORAL | 0 refills | Status: DC

## 2017-07-12 MED ORDER — HYDROCODONE-ACETAMINOPHEN 5-325 MG PO TABS: 1.0000 | ORAL_TABLET | Freq: Four times a day (QID) | ORAL | 0 refills | Status: DC | PRN

## 2017-07-12 NOTE — ED Provider Notes (Signed)
[facility] Provider Note: Lowella DellJ. Lane Rhia Blatchford, MD, FACEP  CSN: [id number] MRN: [id number] ARRIVAL: 07/12/17 at 0354 ROOM: [facility]01/[facility]01   CHIEF COMPLAINT  Back Pain   HISTORY OF PRESENT ILLNESS  07/12/17 4:15 AM Marie Schoonerajuana Marie Kramer is a 42 y.o. female with a 1 day history of pain in her left lower back radiating down the back of the left leg.  She does lifting at work but is not aware of a specific event or injury that triggered the pain.  She rates her pain as a 7 out of 10, worse with movement or weightbearing. She is not having any numbness or weakness.  She denies saddle anesthesia or changes in bowel or bladder function.  Consult[place] with [place] [facility]  [place] substances [place] reveals the patient has received 1 prescription for hydrocodone in the past year.   Past Medical History:  Diagnosis Date  . Asthma   . Bronchitis     Past Surgical History:  Procedure Laterality Date  . TUBAL LIGATION      No family history on file.  Social History   Tobacco Use  . Smoking status: Never Smoker  . Smokeless tobacco: Never Used  Substance Use Topics  . Alcohol use: No  . Drug use: No    Prior to Admission medications   Medication Sig Start Date End Date Taking? Authorizing Provider  ibuprofen (ADVIL,MOTRIN) 800 MG tablet Take 1 tablet (800 mg total) by mouth 3 (three) times daily. 09/17/16   Garlon HatchetSanders, Lisa M, PA-C  naproxen (NAPROSYN) 500 MG tablet Take 1 tablet (500 mg total) by mouth 2 (two) times daily with a meal. 02/25/17   Ofilia Neaslark, Michael L, PA-C  neomycin-polymyxin-hydrocortisone (CORTISPORIN) OTIC solution Place 4 drops into the right ear 4 (four) times daily. 02/25/17   Ofilia Neaslark, Michael L, PA-C    Allergies Patient has no known allergies.   REVIEW OF SYSTEMS  Negative except as noted here or in the History of Present Illness.   PHYSICAL EXAMINATION  Initial Vital Signs Blood pressure 98/75, pulse (!) 58, temperature 98.1 F (36.7 C),  temperature source Oral, resp. rate 18, height 5\' 4"  (1.626 m), weight 83.9 kg (185 lb), last menstrual period 06/22/2017, SpO2 100 %.  Examination General: Well-developed, well-nourished female in no acute distress; appearance consistent with age of record HENT: normocephalic; atraumatic Eyes: pupils equal, round and reactive to light; extraocular muscles intact Neck: supple Heart: regular rate and rhythm Lungs: clear to auscultation bilaterally Abdomen: soft; nondistended; nontender; bowel sounds present Back: Left paralumbar tenderness; positive straight leg raise on the left at 45 degrees; negative straight leg raise on the right Extremities: No deformity; full range of motion; pulses normal Neurologic: Awake, alert and oriented; motor function intact in all extremities and symmetric; no facial droop; sensation intact and symmetric in the lower extremities; normal coordination, speech and gait Skin: Warm and dry Psychiatric: Normal mood and affect   RESULTS  Summary of this visit's results, reviewed by myself:   EKG Interpretation  Date/Time:    Ventricular Rate:    PR Interval:    QRS Duration:   QT Interval:    QTC Calculation:   R Axis:     Text Interpretation:        Laboratory Studies: Results for orders placed or performed during the hospital encounter of 07/12/17 (from the past 24 hour(s))  Pregnancy, urine     Status: None   Collection Time: 07/12/17  4:09 AM  Result Value Ref Range  Preg Test, Ur NEGATIVE NEGATIVE  Urinalysis, Routine w reflex microscopic     Status: Abnormal   Collection Time: 07/12/17  4:09 AM  Result Value Ref Range   Color, Urine YELLOW YELLOW   APPearance CLEAR CLEAR   Specific Gravity, Urine >1.030 (H) 1.005 - 1.030   pH 6.0 5.0 - 8.0   Glucose, UA NEGATIVE NEGATIVE mg/dL   Hgb urine dipstick NEGATIVE NEGATIVE   Bilirubin Urine NEGATIVE NEGATIVE   Ketones, ur NEGATIVE NEGATIVE mg/dL   Protein, ur NEGATIVE NEGATIVE mg/dL   Nitrite  NEGATIVE NEGATIVE   Leukocytes, UA NEGATIVE NEGATIVE   Imaging Studies: No results found.  ED COURSE  Nursing notes and initial vitals signs, including pulse oximetry, reviewed.  Vitals:   07/12/17 0428  BP: 98/75  Pulse: (!) 58  Resp: 18  Temp: 98.1 F (36.7 C)  TempSrc: Oral  SpO2: 100%  Weight: 83.9 kg (185 lb)  Height: 5\' 4"  (1.626 m)    PROCEDURES    ED DIAGNOSES     ICD-10-CM   1. Sciatica of left side M54.32        Avel Ogawa, MD 07/12/17 [id number]

## 2017-07-12 NOTE — ED Triage Notes (Signed)
Left side back pain x 1 day  No know inj, denies ua sx  Pain increased w movement

## 2017-07-16 ENCOUNTER — Ambulatory Visit (INDEPENDENT_AMBULATORY_CARE_PROVIDER_SITE_OTHER): Payer: Federal, State, Local not specified - PPO | Admitting: Orthopaedic Surgery

## 2017-07-16 ENCOUNTER — Encounter (INDEPENDENT_AMBULATORY_CARE_PROVIDER_SITE_OTHER): Payer: Self-pay | Admitting: Orthopaedic Surgery

## 2017-07-16 ENCOUNTER — Ambulatory Visit (INDEPENDENT_AMBULATORY_CARE_PROVIDER_SITE_OTHER): Payer: Federal, State, Local not specified - PPO

## 2017-07-16 DIAGNOSIS — M5442 Lumbago with sciatica, left side: Secondary | ICD-10-CM | POA: Diagnosis not present

## 2017-07-16 MED ORDER — PREDNISONE 10 MG (21) PO TBPK: ORAL_TABLET | ORAL | 0 refills | Status: DC

## 2017-07-16 MED ORDER — METHOCARBAMOL 500 MG PO TABS: 500.0000 mg | ORAL_TABLET | Freq: Three times a day (TID) | ORAL | 0 refills | Status: DC | PRN

## 2017-07-16 NOTE — Progress Notes (Signed)
Office Visit Note   Patient: Marie Kramer           Date of Birth: 12-May-1976           MRN: [id number] Visit Date: 07/16/2017              Requested by: No referring provider defined for this encounter. PCP: Patient, No Pcp Per   Assessment & Plan: Visit Diagnoses:  1. Acute midline low back pain with left-sided sciatica     Plan: At this point I think it is appropriate to place the patient on a Sterapred Dosepak.  I am also giving her a prescription for Robaxin.  We are providing her with lumbar exercises.  If she is not any better in the next several weeks she will call and let us know we will get an MRI of her lumbar spine.    Follow-Up Instructions: Return if symptoms worsen or fail to improve.   Orders:  Orders Placed This Encounter  Procedures  . XR Lumbar Spine 2-3 Views   Meds ordered this encounter  Medications  . predniSONE (STERAPRED UNI-PAK 21 TAB) 10 MG (21) TBPK tablet    Sig: Take as directed    Dispense:  21 tablet    Refill:  0    Order Specific Question:   Supervising Provider    Answer:   Tarry KosXU, NAIPING M [phone][[id number]]  . methocarbamol (ROBAXIN) 500 MG tablet    Sig: Take 1 tablet (500 mg total) by mouth 3 (three) times daily as needed for muscle spasms.    Dispense:  60 tablet    Refill:  0    Order Specific Question:   Supervising Provider    Answer:   Tarry KosXU, NAIPING M [[id number]]      Procedures: No procedures performed   Clinical Data: No additional findings.   Subjective: Chief Complaint  Patient presents with  . 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  This began on 07/11/2017 while at work lifting an extra heavy box.  She often lifts heavy objects, but this 1 was a little heavier than regular.  She  had immediate left lower back pain radiating down the left leg.  She was seen in the ED on  07/12/2017 for this.  She was given Norco.  She comes in today for follow-up.  The pain she has occurs when lifting and sitting for too long, as well as flexion of the back.  She does note radicular symptoms to her entire left foot.  No saddle paresthesias, no bowel or bladder incontinence.  Review of Systems as detailed in HPI.  All others reviewed and are negative.   Objective: Vital Signs: LMP 06/22/2017 (Approximate)   Physical Exam well-developed well-nourished female in no acute distress.  Alert and oriented x3.  Ortho Exam examination of the lumbar spine reveals moderate paraspinous tenderness on the left.  Markedly positive logroll on the left negative on the right.  She does have lumbar pain with flexion of the lumbar spine.  No pain with extension  Specialty Comments:  No specialty comments available.  Imaging: Xr Lumbar Spine 2-3 Views  Result Date: 07/16/2017 X-rays of the lumbar spine are negative for fracture or other acute findings.  There is straightening of the spine to suggest muscle spasm.  On the AP  view, it does appear there may have been some scoliosis in the past.    PMFS History: Patient Active Problem List   Diagnosis Date Noted  . Acute midline low back pain with left-sided sciatica 07/16/2017  . Synovial cyst of left knee 01/01/2017  . Chronic pain of left knee 06/19/2016  . Left hamstring injury 06/11/2016   Past Medical History:  Diagnosis Date  . Asthma   . Bronchitis     History reviewed. No pertinent family history.  Past Surgical History:  Procedure Laterality Date  . TUBAL LIGATION     Social History   Occupational History  . Not on file  Tobacco Use  . Smoking status: Never Smoker  . Smokeless tobacco: Never Used  Substance and Sexual Activity  . Alcohol use: No  . Drug use: No  . Sexual activity: Yes    Birth control/protection: None, Surgical

## 2017-08-26 ENCOUNTER — Ambulatory Visit (INDEPENDENT_AMBULATORY_CARE_PROVIDER_SITE_OTHER): Payer: Self-pay | Admitting: Orthopaedic Surgery

## 2017-08-30 DIAGNOSIS — R0602 Shortness of breath: Secondary | ICD-10-CM | POA: Diagnosis not present

## 2017-08-30 DIAGNOSIS — R509 Fever, unspecified: Secondary | ICD-10-CM | POA: Diagnosis not present

## 2017-08-30 DIAGNOSIS — J111 Influenza due to unidentified influenza virus with other respiratory manifestations: Secondary | ICD-10-CM | POA: Diagnosis not present

## 2017-09-02 ENCOUNTER — Telehealth (INDEPENDENT_AMBULATORY_CARE_PROVIDER_SITE_OTHER): Payer: Self-pay

## 2017-09-02 NOTE — Telephone Encounter (Signed)
Left vm to call back to scheduled appt  °

## 2017-09-02 NOTE — Telephone Encounter (Signed)
Patient would like an appt.under WC.  Can you please call patient to get her claim information and to schedule her appt.with Dr. Roda ShuttersXu, left knee.

## 2017-09-04 ENCOUNTER — Telehealth (INDEPENDENT_AMBULATORY_CARE_PROVIDER_SITE_OTHER): Payer: Self-pay

## 2017-09-04 NOTE — Telephone Encounter (Signed)
Called patient no answer. LMOM. We received email, patient needing an appointment for left knee.  LMOM to return call to make appt.(left knee) WC

## 2017-09-10 DIAGNOSIS — S335XXA Sprain of ligaments of lumbar spine, initial encounter: Secondary | ICD-10-CM | POA: Diagnosis not present

## 2017-09-10 DIAGNOSIS — T1490XA Injury, unspecified, initial encounter: Secondary | ICD-10-CM | POA: Diagnosis not present

## 2017-09-10 DIAGNOSIS — J4530 Mild persistent asthma, uncomplicated: Secondary | ICD-10-CM | POA: Diagnosis not present

## 2017-09-10 DIAGNOSIS — D508 Other iron deficiency anemias: Secondary | ICD-10-CM | POA: Diagnosis not present

## 2017-10-21 ENCOUNTER — Ambulatory Visit (INDEPENDENT_AMBULATORY_CARE_PROVIDER_SITE_OTHER): Payer: Self-pay | Admitting: Physician Assistant

## 2017-11-14 DIAGNOSIS — R252 Cramp and spasm: Secondary | ICD-10-CM | POA: Diagnosis not present

## 2017-11-22 DIAGNOSIS — J0110 Acute frontal sinusitis, unspecified: Secondary | ICD-10-CM | POA: Diagnosis not present

## 2017-11-22 DIAGNOSIS — R51 Headache: Secondary | ICD-10-CM | POA: Diagnosis not present

## 2017-11-22 DIAGNOSIS — R42 Dizziness and giddiness: Secondary | ICD-10-CM | POA: Diagnosis not present

## 2017-11-22 DIAGNOSIS — J45909 Unspecified asthma, uncomplicated: Secondary | ICD-10-CM | POA: Diagnosis not present

## 2017-11-22 DIAGNOSIS — R040 Epistaxis: Secondary | ICD-10-CM | POA: Diagnosis not present

## 2018-01-08 DIAGNOSIS — R001 Bradycardia, unspecified: Secondary | ICD-10-CM | POA: Diagnosis not present

## 2018-01-08 DIAGNOSIS — R110 Nausea: Secondary | ICD-10-CM | POA: Diagnosis not present

## 2018-01-08 DIAGNOSIS — R51 Headache: Secondary | ICD-10-CM | POA: Diagnosis not present

## 2018-01-08 DIAGNOSIS — G43719 Chronic migraine without aura, intractable, without status migrainosus: Secondary | ICD-10-CM | POA: Diagnosis not present

## 2018-01-08 DIAGNOSIS — R079 Chest pain, unspecified: Secondary | ICD-10-CM | POA: Diagnosis not present

## 2018-01-08 DIAGNOSIS — G43019 Migraine without aura, intractable, without status migrainosus: Secondary | ICD-10-CM | POA: Diagnosis not present

## 2018-01-08 DIAGNOSIS — R0789 Other chest pain: Secondary | ICD-10-CM | POA: Diagnosis not present

## 2018-01-29 DIAGNOSIS — R300 Dysuria: Secondary | ICD-10-CM | POA: Diagnosis not present

## 2018-01-29 DIAGNOSIS — M25511 Pain in right shoulder: Secondary | ICD-10-CM | POA: Diagnosis not present

## 2018-01-29 DIAGNOSIS — R42 Dizziness and giddiness: Secondary | ICD-10-CM | POA: Diagnosis not present

## 2018-02-24 ENCOUNTER — Ambulatory Visit: Payer: Federal, State, Local not specified - PPO | Admitting: Pediatrics

## 2018-02-28 ENCOUNTER — Ambulatory Visit (INDEPENDENT_AMBULATORY_CARE_PROVIDER_SITE_OTHER)

## 2018-02-28 ENCOUNTER — Ambulatory Visit (INDEPENDENT_AMBULATORY_CARE_PROVIDER_SITE_OTHER): Admitting: Orthopaedic Surgery

## 2018-02-28 DIAGNOSIS — G8929 Other chronic pain: Secondary | ICD-10-CM

## 2018-02-28 DIAGNOSIS — M25562 Pain in left knee: Secondary | ICD-10-CM

## 2018-02-28 MED ORDER — METHYLPREDNISOLONE ACETATE 40 MG/ML IJ SUSP
40.0000 mg | INTRAMUSCULAR | Status: AC | PRN
  Administered 2018-02-28: 40 mg via INTRA_ARTICULAR

## 2018-02-28 MED ORDER — LIDOCAINE HCL 1 % IJ SOLN
2.0000 mL | INTRAMUSCULAR | Status: AC | PRN
  Administered 2018-02-28: 2 mL

## 2018-02-28 MED ORDER — BUPIVACAINE HCL 0.5 % IJ SOLN
2.0000 mL | INTRAMUSCULAR | Status: AC | PRN
  Administered 2018-02-28: 2 mL via INTRA_ARTICULAR

## 2018-02-28 NOTE — Progress Notes (Signed)
Office Visit Note   Patient: Marie Kramer           Date of Birth: May 18, 1976           MRN: [id number] Visit Date: 02/28/2018              Requested by: No referring provider defined for this encounter. PCP: Patient, No Pcp Per   Assessment & Plan: Visit Diagnoses:  1. Chronic pain of left knee     Plan: Impression is left knee pain which is either exacerbation of her osteoarthritis or meniscal pathology.  I would like to try cortisone injection to see if this will settle it down.  Compression knee brace was provided today as well as referral to outpatient physical therapy for strengthening.  If she does not improve this we may need to consider repeat MRI to evaluate for a torn lateral meniscus.  Follow-Up Instructions: Return if symptoms worsen or fail to improve.   Orders:  Orders Placed This Encounter  Procedures  . XR KNEE 3 VIEW LEFT   No orders of the defined types were placed in this encounter.     Procedures: Large Joint Inj: L knee on 02/28/2018 10:43 AM Details: 22 G needle Medications: 2 mL bupivacaine 0.5 %; 2 mL lidocaine 1 %; 40 mg methylPREDNISolone acetate 40 MG/ML Outcome: tolerated well, no immediate complications Patient was prepped and draped in the usual sterile fashion.       Clinical Data: No additional findings.   Subjective: Chief Complaint  Patient presents with  . Left Knee - Pain    Marie Kramer is a 42 year old female comes in with 2 months of recent increased left knee pain.  She is having swelling down into her ankle with decreased range of motion and strength.  She endorses giving way.   Review of Systems  Constitutional: Negative.   HENT: Negative.   Eyes: Negative.   Respiratory: Negative.   Cardiovascular: Negative.   Endocrine: Negative.   Musculoskeletal: Negative.   Neurological: Negative.   Hematological: Negative.   Psychiatric/Behavioral: Negative.   All other systems reviewed and are  negative.    Objective: Vital Signs: There were no vitals taken for this visit.  Physical Exam  Constitutional: She is oriented to person, place, and time. She appears well-developed and well-nourished.  Pulmonary/Chest: Effort normal.  Neurological: She is alert and oriented to person, place, and time.  Skin: Skin is warm. Capillary refill takes less than 2 seconds.  Psychiatric: She has a normal mood and affect. Her behavior is normal. Judgment and thought content normal.  Nursing note and vitals reviewed.   Ortho Exam Left knee exam shows no joint effusion.  Collaterals and cruciates are stable.  Mild lateral joint line tenderness. Specialty Comments:  No specialty comments available.  Imaging: Xr Knee 3 View Left  Result Date: 02/28/2018 No acute or structural abnormalities.    PMFS History: Patient Active Problem List   Diagnosis Date Noted  . Acute midline low back pain with left-sided sciatica 07/16/2017  . Synovial cyst of left knee 01/01/2017  . Chronic pain of left knee 06/19/2016  . Left hamstring injury 06/11/2016   Past Medical History:  Diagnosis Date  . Asthma   . Bronchitis     No family history on file.  Past Surgical History:  Procedure Laterality Date  . TUBAL LIGATION     Social History   Occupational History  . Not on file  Tobacco Use  . Smoking  status: Never Smoker  . Smokeless tobacco: Never Used  Substance and Sexual Activity  . Alcohol use: No  . Drug use: No  . Sexual activity: Yes    Birth control/protection: None, Surgical

## 2018-03-24 DIAGNOSIS — N644 Mastodynia: Secondary | ICD-10-CM | POA: Diagnosis not present

## 2018-03-25 ENCOUNTER — Other Ambulatory Visit: Payer: Self-pay | Admitting: Family Medicine

## 2018-03-25 DIAGNOSIS — N644 Mastodynia: Secondary | ICD-10-CM

## 2018-03-26 DIAGNOSIS — R5383 Other fatigue: Secondary | ICD-10-CM | POA: Diagnosis not present

## 2018-03-26 DIAGNOSIS — R350 Frequency of micturition: Secondary | ICD-10-CM | POA: Diagnosis not present

## 2018-03-26 DIAGNOSIS — N6009 Solitary cyst of unspecified breast: Secondary | ICD-10-CM | POA: Diagnosis not present

## 2018-03-28 ENCOUNTER — Ambulatory Visit
Admission: RE | Admit: 2018-03-28 | Discharge: 2018-03-28 | Disposition: A | Discharge status: Home / Self Care | Payer: Federal, State, Local not specified - PPO | Source: Ambulatory Visit | Attending: Family Medicine | Admitting: Family Medicine

## 2018-03-28 DIAGNOSIS — N644 Mastodynia: Secondary | ICD-10-CM

## 2018-03-28 DIAGNOSIS — R922 Inconclusive mammogram: Secondary | ICD-10-CM | POA: Diagnosis not present

## 2018-04-15 DIAGNOSIS — R040 Epistaxis: Secondary | ICD-10-CM | POA: Diagnosis not present

## 2018-04-15 DIAGNOSIS — R51 Headache: Secondary | ICD-10-CM | POA: Diagnosis not present

## 2018-06-06 DIAGNOSIS — M7661 Achilles tendinitis, right leg: Secondary | ICD-10-CM | POA: Diagnosis not present

## 2018-06-13 ENCOUNTER — Ambulatory Visit (INDEPENDENT_AMBULATORY_CARE_PROVIDER_SITE_OTHER): Payer: Self-pay

## 2018-06-13 ENCOUNTER — Encounter (INDEPENDENT_AMBULATORY_CARE_PROVIDER_SITE_OTHER): Payer: Self-pay | Admitting: Orthopaedic Surgery

## 2018-06-13 ENCOUNTER — Ambulatory Visit (INDEPENDENT_AMBULATORY_CARE_PROVIDER_SITE_OTHER): Payer: Federal, State, Local not specified - PPO | Admitting: Orthopaedic Surgery

## 2018-06-13 DIAGNOSIS — M9261 Juvenile osteochondrosis of tarsus, right ankle: Secondary | ICD-10-CM | POA: Diagnosis not present

## 2018-06-13 DIAGNOSIS — M7661 Achilles tendinitis, right leg: Secondary | ICD-10-CM | POA: Insufficient documentation

## 2018-06-13 HISTORY — DX: Juvenile osteochondrosis of tarsus, right ankle: M92.61

## 2018-06-13 MED ORDER — DICLOFENAC SODIUM 1 % TD GEL: 2.0000 g | Freq: Four times a day (QID) | TRANSDERMAL | 1 refills | Status: DC

## 2018-06-13 NOTE — Progress Notes (Signed)
Office Visit Note   Patient: Marie Kramer           Date of Birth: [birth year]           MRN: [id number] Visit Date: 06/13/2018              Requested by: No referring provider defined for this encounter. PCP: Patient, No Pcp Per   Assessment & Plan: Visit Diagnoses:  1. Haglund's deformity of right heel   2. Achilles tendinitis, right leg     Plan: Impression is right heel Haglund's deformity with Achilles tendinitis.  We will place the patient in a cam walker weightbearing as tolerated.  We will also call in Voltaren gel and start her in physical therapy for range of motion exercises and modalities.  Follow-up with us in 5 weeks time for recheck.  Follow-Up Instructions: Return in about 5 weeks (around 07/18/2018).   Orders:  Orders Placed This Encounter  Procedures  . XR Os Calcis Right   Meds ordered this encounter  Medications  . diclofenac sodium (VOLTAREN) 1 % GEL    Sig: Apply 2 g topically 4 (four) times daily.    Dispense:  1 Tube    Refill:  1      Procedures: No procedures performed   Clinical Data: No additional findings.   Subjective: Chief Complaint  Patient presents with  . Right Ankle - Pain    HPI patient is a pleasant 42 year old female who presents to our clinic today with right heel pain.  This began approximately 4 days ago without any known injury or change in activity.  All of her pain is to the retrocalcaneal area.  Pain is worse with wearing shoes where she is sitting for too long a period of time.  She does note burning to that area as well.  Over-the-counter medications do not seem to significantly help.  Review of Systems as detailed in HPI.  All others reviewed and are negative.   Objective: Vital Signs: There were no vitals taken for this visit.  Physical Exam well-developed and well-nourished female in no acute distress.  Alert and oriented x3.  Ortho Exam examination of her right heel reveals marked tenderness over the  retrocalcaneal spur and into the distal Achilles.  No tenderness to the pre-Achilles bursa.  No palpable deformity.  Negative Thompson's test.  Limited dorsiflexion.  She is neurovascularly intact distally.  Specialty Comments:  No specialty comments available.  Imaging: Xr Os Calcis Right  Result Date: 06/13/2018 X-rays demonstrate a retrocalcaneal spur.  No avulsion and no calcifications within the Achilles noted.    PMFS History: Patient Active Problem List   Diagnosis Date Noted  . Haglund's deformity of right heel 06/13/2018  . Achilles tendinitis, right leg 06/13/2018  . Acute midline low back pain with left-sided sciatica 07/16/2017  . Synovial cyst of left knee 01/01/2017  . Chronic pain of left knee 06/19/2016  . Left hamstring injury 06/11/2016   Past Medical History:  Diagnosis Date  . Asthma   . Bronchitis     History reviewed. No pertinent family history.  Past Surgical History:  Procedure Laterality Date  . TUBAL LIGATION     Social History   Occupational History  . Not on file  Tobacco Use  . Smoking status: Never Smoker  . Smokeless tobacco: Never Used  Substance and Sexual Activity  . Alcohol use: No  . Drug use: No  . Sexual activity: Yes    Birth  control/protection: None, Surgical

## 2018-07-10 ENCOUNTER — Encounter: Payer: Self-pay | Admitting: Allergy and Immunology

## 2018-07-10 ENCOUNTER — Ambulatory Visit: Payer: Federal, State, Local not specified - PPO | Admitting: Allergy and Immunology

## 2018-07-10 VITALS — BP 100/60 | HR 60 | Temp 98.5°F | Resp 16 | Ht 65.0 in | Wt 183.6 lb

## 2018-07-10 DIAGNOSIS — H1013 Acute atopic conjunctivitis, bilateral: Secondary | ICD-10-CM

## 2018-07-10 DIAGNOSIS — H1010 Acute atopic conjunctivitis, unspecified eye: Secondary | ICD-10-CM | POA: Insufficient documentation

## 2018-07-10 DIAGNOSIS — J3089 Other allergic rhinitis: Secondary | ICD-10-CM

## 2018-07-10 DIAGNOSIS — J4530 Mild persistent asthma, uncomplicated: Secondary | ICD-10-CM

## 2018-07-10 DIAGNOSIS — J302 Other seasonal allergic rhinitis: Secondary | ICD-10-CM | POA: Insufficient documentation

## 2018-07-10 DIAGNOSIS — J4540 Moderate persistent asthma, uncomplicated: Secondary | ICD-10-CM | POA: Insufficient documentation

## 2018-07-10 MED ORDER — FLUTICASONE PROPIONATE HFA 110 MCG/ACT IN AERO: 2.0000 | INHALATION_SPRAY | Freq: Two times a day (BID) | RESPIRATORY_TRACT | 5 refills | Status: DC

## 2018-07-10 MED ORDER — ALBUTEROL SULFATE HFA 108 (90 BASE) MCG/ACT IN AERS: INHALATION_SPRAY | RESPIRATORY_TRACT | 2 refills | Status: DC

## 2018-07-10 MED ORDER — OLOPATADINE HCL 0.7 % OP SOLN: 1.0000 [drp] | Freq: Every day | OPHTHALMIC | 5 refills | Status: DC | PRN

## 2018-07-10 MED ORDER — LEVOCETIRIZINE DIHYDROCHLORIDE 5 MG PO TABS: 5.0000 mg | ORAL_TABLET | Freq: Every day | ORAL | 5 refills | Status: DC | PRN

## 2018-07-10 MED ORDER — MONTELUKAST SODIUM 10 MG PO TABS: 10.0000 mg | ORAL_TABLET | Freq: Every day | ORAL | 5 refills | Status: DC

## 2018-07-10 NOTE — Assessment & Plan Note (Signed)
   Treatment plan as outlined above for allergic rhinitis.  A prescription has been provided for Pazeo, one drop per eye daily as needed.  I have also recommended eye lubricant drops (i.e., Natural Tears) as needed. 

## 2018-07-10 NOTE — Assessment & Plan Note (Signed)
   Aeroallergen avoidance measures have been discussed and provided in written form.  Montelukast has been prescribed (as above).  A prescription has been provided for levocetirizine, 5 mg daily as needed.  A prescription has been provided for [facility], 2 actuations per nostril twice a day. Proper technique has been discussed and demonstrated.  Nasal saline spray (i.e., Simply Saline) or nasal saline lavage (i.e., NeilMed) is recommended as needed and prior to medicated nasal sprays.  If allergen avoidance measures and medications fail to adequately relieve symptoms, aeroallergen immunotherapy will be considered.

## 2018-07-10 NOTE — Progress Notes (Signed)
New Patient Note  RE: Marie Kramer MRN: [id number] DOB: 06/13/1976 Date of Office Visit: 07/10/2018  Referring provider: No ref. provider found Primary care provider: Patient, No Pcp Per  Chief Complaint: Asthma; Nasal Congestion; and Conjunctivitis   History of present illness: Marie Kramer is a 43 y.o. female presenting today for evaluation of asthma and rhinoconjunctivitis.  She reports that which she was diagnosed with asthma as a child with typical symptoms consisting of wheezing, chest tightness, coughing, and dyspnea.  Her asthma symptoms are triggered by exercise, hot weather, upper respiratory tract infections, as well as exposure to pollen and dust.  She typically requires albuterol 2-3 times per week and experiences nocturnal awakenings due to lower respiratory symptoms a few times per year.  She has Flovent 44 g on hand for as needed use and typically uses Flovent every few weeks when exposed to dust at work.  Marie Kramer experiences nasal congestion, rhinorrhea, sneezing, postnasal drainage, nasal pruritus, ocular pruritus, and occasional sinus pressure between the eyes.  These symptoms occur year-round and are more frequent and severe during the springtime and in the fall.  She attempts to control the symptoms with loratadine as needed.  Assessment and plan: Mild persistent asthma  A prescription has been provided for montelukast 10 mg daily at bedtime.  During respiratory tract infections or asthma flares, add Flovent 110g 2 inhalations 2 times per day until symptoms have returned to baseline.  To maximize pulmonary deposition, a spacer has been provided along with instructions for its proper administration with an HFA inhaler.  Continue albuterol HFA, 1 to 2 inhalations every 4-6 hours if needed.  I have also recommended using albuterol 15 minutes prior to exercise.  Subjective and objective measures of pulmonary function will be followed and the treatment plan will  be adjusted accordingly.  Perennial and seasonal allergic rhinitis  Aeroallergen avoidance measures have been discussed and provided in written form.  Montelukast has been prescribed (as above).  A prescription has been provided for levocetirizine, 5 mg daily as needed.  A prescription has been provided for [facility], 2 actuations per nostril twice a day. Proper technique has been discussed and demonstrated.  Nasal saline spray (i.e., Simply Saline) or nasal saline lavage (i.e., NeilMed) is recommended as needed and prior to medicated nasal sprays.  If allergen avoidance measures and medications fail to adequately relieve symptoms, aeroallergen immunotherapy will be considered.  Allergic conjunctivitis  Treatment plan as outlined above for allergic rhinitis.  A prescription has been provided for Pazeo, one drop per eye daily as needed.  I have also recommended eye lubricant drops (i.e., Natural Tears) as needed.   Meds ordered this encounter  Medications  . fluticasone (FLOVENT HFA) 110 MCG/ACT inhaler    Sig: Inhale 2 puffs into the lungs 2 (two) times daily.    Dispense:  1 Inhaler    Refill:  5  . montelukast (SINGULAIR) 10 MG tablet    Sig: Take 1 tablet (10 mg total) by mouth at bedtime.    Dispense:  30 tablet    Refill:  5  . albuterol (PROAIR HFA) 108 (90 Base) MCG/ACT inhaler    Sig: Use 1-2 puffs every 4-6 hours as needed    Dispense:  1 Inhaler    Refill:  2  . levocetirizine (XYZAL) 5 MG tablet    Sig: Take 1 tablet (5 mg total) by mouth daily as needed for allergies.    Dispense:  30 tablet  Refill:  5  . Olopatadine HCl (PAZEO) 0.7 % SOLN    Sig: Place 1 drop into both eyes daily as needed.    Dispense:  1 Bottle    Refill:  5    Diagnostics: Spirometry: Spirometry reveals an FVC of 3.16 L and an FEV1 of 2.64 L (104% predicted) without significant postbronchodilator improvement.  This study was performed while the patient was asymptomatic.  Please see  scanned spirometry results for details. Allergy skin testing: Positive to grass pollen, weed pollen, ragweed pollen, tree pollen, molds, cat hair, dog epithelia, cockroach antigen, and dust mite antigen.   Physical examination: Blood pressure 100/60, pulse 60, temperature 98.5 F (36.9 C), temperature source Oral, resp. rate 16, height 5\' 5"  (1.651 m), weight 183 lb 9.6 oz (83.3 kg), SpO2 98 %.  General: Alert, interactive, in no acute distress. HEENT: TMs pearly gray, turbinates edematous with thick discharge, post-pharynx moderately erythematous. Neck: Supple without lymphadenopathy. Lungs: Clear to auscultation without wheezing, rhonchi or rales. CV: Normal S1, S2 without murmurs. Abdomen: Nondistended, nontender. Skin: Warm and dry, without lesions or rashes. Extremities:  No clubbing, cyanosis or edema. Neuro:   Grossly intact.  Review of systems:  Review of systems negative except as noted in HPI / PMHx or noted below: Review of Systems  Constitutional: Negative.   HENT: Negative.   Eyes: Negative.   Respiratory: Negative.   Cardiovascular: Negative.   Gastrointestinal: Negative.   Genitourinary: Negative.   Musculoskeletal: Negative.   Skin: Negative.   Neurological: Negative.   Endo/Heme/Allergies: Negative.   Psychiatric/Behavioral: Negative.     Past medical history:  Past Medical History:  Diagnosis Date  . Asthma   . Bronchitis     Past surgical history:  Past Surgical History:  Procedure Laterality Date  . TUBAL LIGATION      Family history: Family History  Problem Relation Age of Onset  . Allergic rhinitis Mother   . Asthma Son   . Allergic rhinitis Son   . Asthma Daughter   . Allergic rhinitis Daughter   . Bronchitis Daughter   . Asthma Son   . Allergic rhinitis Son   . Eczema Neg Hx   . Urticaria Neg Hx   . Immunodeficiency Neg Hx   . Angioedema Neg Hx     Social history: Social History   Socioeconomic History  . Marital status:  Single    Spouse name: Not on file  . Number of children: Not on file  . Years of education: Not on file  . Highest education level: Not on file  Occupational History  . Not on file  Social Needs  . Financial resource strain: Not on file  . Food insecurity:    Worry: Not on file    Inability: Not on file  . Transportation needs:    Medical: Not on file    Non-medical: Not on file  Tobacco Use  . Smoking status: Never Smoker  . Smokeless tobacco: Never Used  Substance and Sexual Activity  . Alcohol use: No  . Drug use: No  . Sexual activity: Yes    Birth control/protection: None, Surgical  Lifestyle  . Physical activity:    Days per week: Not on file    Minutes per session: Not on file  . Stress: Not on file  Relationships  . Social connections:    Talks on phone: Not on file    Gets together: Not on file    Attends religious service: Not on  file    Active member of club or organization: Not on file    Attends meetings of clubs or organizations: Not on file    Relationship status: Not on file  . Intimate partner violence:    Fear of current or ex partner: Not on file    Emotionally abused: Not on file    Physically abused: Not on file    Forced sexual activity: Not on file  Other Topics Concern  . Not on file  Social History Narrative  . Not on file   Environmental History: The patient lives in a 45 year old house with carpeting throughout, gas heat, and central air.  There is a dog in the home which has access to her bedroom.  There is no known mold/water damage in the home.  She is a non-smoker.  Allergies as of 07/10/2018   No Known Allergies     Medication List       Accurate as of July 10, 2018 12:26 PM. Always use your most recent med list.        albuterol (2.5 MG/3ML) 0.083% nebulizer solution Commonly known as:  PROVENTIL Take 2.5 mg by nebulization every 6 (six) hours as needed for wheezing or shortness of breath.   albuterol 108 (90 Base)  MCG/ACT inhaler Commonly known as:  PROAIR HFA Use 1-2 puffs every 4-6 hours as needed   EPIPEN 2-PAK 0.3 mg/0.3 mL Soaj injection Generic drug:  EPINEPHrine EpiPen 2-Pak 0.3 mg/0.3 mL injection, auto-injector   fluticasone 110 MCG/ACT inhaler Commonly known as:  FLOVENT HFA Inhale 2 puffs into the lungs 2 (two) times daily.   levocetirizine 5 MG tablet Commonly known as:  XYZAL Take 1 tablet (5 mg total) by mouth daily as needed for allergies.   montelukast 10 MG tablet Commonly known as:  SINGULAIR Take 1 tablet (10 mg total) by mouth at bedtime.   Olopatadine HCl 0.7 % Soln Commonly known as:  PAZEO Place 1 drop into both eyes daily as needed.       Known medication allergies: No Known Allergies  I appreciate the opportunity to take part in Dinesha's care. Please do not hesitate to contact me with questions.  Sincerely,   R. Jorene Guest, MD

## 2018-07-10 NOTE — Patient Instructions (Addendum)
Mild persistent asthma  A prescription has been provided for montelukast 10 mg daily at bedtime.  During respiratory tract infections or asthma flares, add Flovent 110g 2 inhalations 2 times per day until symptoms have returned to baseline.  To maximize pulmonary deposition, a spacer has been provided along with instructions for its proper administration with an HFA inhaler.  Continue albuterol HFA, 1 to 2 inhalations every 4-6 hours if needed.  I have also recommended using albuterol 15 minutes prior to exercise.  Subjective and objective measures of pulmonary function will be followed and the treatment plan will be adjusted accordingly.  Perennial and seasonal allergic rhinitis  Aeroallergen avoidance measures have been discussed and provided in written form.  Montelukast has been prescribed (as above).  A prescription has been provided for levocetirizine, 5 mg daily as needed.  [facility], 2 actuations per nostril twice a day. Proper technique has been discussed and demonstrated.  Nasal saline spray (i.e., Simply Saline) or nasal saline lavage (i.e., NeilMed) is recommended as needed and prior to medicated nasal sprays.  If allergen avoidance measures and medications fail to adequately relieve symptoms, aeroallergen immunotherapy will be considered.  Allergic conjunctivitis  Treatment plan as outlined above for allergic rhinitis.  A prescription has been provided for Pazeo, one drop per eye daily as needed.  I have also recommended eye lubricant drops (i.e., Natural Tears) as needed.   Return in about 3 months (around 10/09/2018), or if symptoms worsen or fail to improve.  Control of Dust Mite Allergen  House dust mites play a major role in allergic asthma and rhinitis.  They occur in environments with high humidity wherever human skin, the food for dust mites is found. High levels have been detected in dust obtained from mattresses, pillows,  carpets, upholstered furniture, bed covers, clothes and soft toys.  The principal allergen of the house dust mite is found in its feces.  A gram of dust may contain 1,000 mites and 250,000 fecal particles.  Mite antigen is easily measured in the air during house cleaning activities.    1. Encase mattresses, including the box spring, and pillow, in an air tight cover.  Seal the zipper end of the encased mattresses with wide adhesive tape. 2. Wash the bedding in water of 130 degrees Farenheit weekly.  Avoid cotton comforters/quilts and flannel bedding: the most ideal bed covering is the dacron comforter. 3. Remove all upholstered furniture from the bedroom. 4. Remove carpets, carpet padding, rugs, and non-washable window drapes from the bedroom.  Wash drapes weekly or use plastic window coverings. 5. Remove all non-washable stuffed toys from the bedroom.  Wash stuffed toys weekly. 6. Have the room cleaned frequently with a vacuum cleaner and a damp dust-mop.  The patient should not be in a room which is being cleaned and should wait 1 hour after cleaning before going into the room. 7. Close and seal all heating outlets in the bedroom.  Otherwise, the room will become filled with dust-laden air.  An electric heater can be used to heat the room. Reduce indoor humidity to less than 50%.  Do not use a humidifier.   Reducing Pollen Exposure  The American Academy of Allergy, Asthma and Immunology suggests the following steps to reduce your exposure to pollen during allergy seasons.    1. Do not hang sheets or clothing out to dry; pollen may collect on these items. 2. Do not mow lawns or spend time around freshly cut  grass; mowing stirs up pollen. 3. Keep windows closed at night.  Keep car windows closed while driving. 4. Minimize morning activities outdoors, a time when pollen counts are usually at their highest. 5. Stay indoors as much as possible when pollen counts or humidity is high and on windy days  when pollen tends to remain in the air longer. 6. Use air conditioning when possible.  Many air conditioners have filters that trap the pollen spores. 7. Use a HEPA room air filter to remove pollen form the indoor air you breathe.   Control of Dog or Cat Allergen  Avoidance is the best way to manage a dog or cat allergy. If you have a dog or cat and are allergic to dog or cats, consider removing the dog or cat from the home. If you have a dog or cat but don't want to find it a new home, or if your family wants a pet even though someone in the household is allergic, here are some strategies that may help keep symptoms at bay:  1. Keep the pet out of your bedroom and restrict it to only a few rooms. Be advised that keeping the dog or cat in only one room will not limit the allergens to that room. 2. Don't pet, hug or kiss the dog or cat; if you do, wash your hands with soap and water. 3. High-efficiency particulate air (HEPA) cleaners run continuously in a bedroom or living room can reduce allergen levels over time. 4. Place electrostatic material sheet in the air inlet vent in the bedroom. 5. Regular use of a high-efficiency vacuum cleaner or a central vacuum can reduce allergen levels. 6. Giving your dog or cat a bath at least once a week can reduce airborne allergen.   Control of Mold Allergen  Mold and fungi can grow on a variety of surfaces provided certain temperature and moisture conditions exist.  Outdoor molds grow on plants, decaying vegetation and soil.  The major outdoor mold, Alternaria and Cladosporium, are found in very high numbers during hot and dry conditions.  Generally, a late Summer - Fall peak is seen for common outdoor fungal spores.  Rain will temporarily lower outdoor mold spore count, but counts rise rapidly when the rainy period ends.  The most important indoor molds are Aspergillus and Penicillium.  Dark, humid and poorly ventilated basements are ideal sites for mold  growth.  The next most common sites of mold growth are the bathroom and the kitchen.  Outdoor [employer] 1. Use air conditioning and keep windows closed 2. Avoid exposure to decaying vegetation. 3. Avoid leaf raking. 4. Avoid grain handling. 5. Consider wearing a face mask if working in moldy areas.  Indoor Mold Control 1. Maintain humidity below 50%. 2. Clean washable surfaces with 5% bleach solution. 3. Remove sources e.g. Contaminated carpets.   Control of Cockroach Allergen  Cockroach allergen has been identified as an important cause of acute attacks of asthma, especially in urban settings.  There are fifty-five species of cockroach that exist in the [place], however only three, the [place], [place] species produce allergen that can affect patients with Asthma.  Allergens can be obtained from fecal particles, egg casings and secretions from cockroaches.    1. Remove food sources. 2. Reduce access to water. 3. Seal access and entry points. 4. Spray runways with 0.5-1% Diazinon or Chlorpyrifos 5. Blow boric acid power under stoves and refrigerator. 6. Place bait stations (hydramethylnon) at feeding sites.

## 2018-07-10 NOTE — Assessment & Plan Note (Signed)
   A prescription has been provided for montelukast 10 mg daily at bedtime.  During respiratory tract infections or asthma flares, add Flovent 110g 2 inhalations 2 times per day until symptoms have returned to baseline.  To maximize pulmonary deposition, a spacer has been provided along with instructions for its proper administration with an HFA inhaler.  Continue albuterol HFA, 1 to 2 inhalations every 4-6 hours if needed.  I have also recommended using albuterol 15 minutes prior to exercise.  Subjective and objective measures of pulmonary function will be followed and the treatment plan will be adjusted accordingly.

## 2018-07-18 ENCOUNTER — Ambulatory Visit (INDEPENDENT_AMBULATORY_CARE_PROVIDER_SITE_OTHER): Payer: Self-pay | Admitting: Orthopaedic Surgery

## 2018-08-12 DIAGNOSIS — R05 Cough: Secondary | ICD-10-CM | POA: Diagnosis not present

## 2018-08-12 DIAGNOSIS — J101 Influenza due to other identified influenza virus with other respiratory manifestations: Secondary | ICD-10-CM | POA: Diagnosis not present

## 2018-08-12 DIAGNOSIS — J111 Influenza due to unidentified influenza virus with other respiratory manifestations: Secondary | ICD-10-CM | POA: Diagnosis not present

## 2018-10-15 ENCOUNTER — Ambulatory Visit: Payer: Federal, State, Local not specified - PPO | Admitting: Allergy and Immunology

## 2018-12-23 DIAGNOSIS — N390 Urinary tract infection, site not specified: Secondary | ICD-10-CM | POA: Diagnosis not present

## 2018-12-24 ENCOUNTER — Other Ambulatory Visit: Payer: Self-pay | Admitting: *Deleted

## 2018-12-24 DIAGNOSIS — R6889 Other general symptoms and signs: Secondary | ICD-10-CM | POA: Diagnosis not present

## 2018-12-24 DIAGNOSIS — Z20822 Contact with and (suspected) exposure to covid-19: Secondary | ICD-10-CM

## 2018-12-24 NOTE — Progress Notes (Signed)
lab

## 2018-12-26 DIAGNOSIS — J4520 Mild intermittent asthma, uncomplicated: Secondary | ICD-10-CM | POA: Diagnosis not present

## 2018-12-28 LAB — NOVEL CORONAVIRUS, NAA: SARS-CoV-2, NAA: NOT DETECTED

## 2019-07-28 DIAGNOSIS — Z20828 Contact with and (suspected) exposure to other viral communicable diseases: Secondary | ICD-10-CM | POA: Diagnosis not present

## 2019-08-31 ENCOUNTER — Other Ambulatory Visit: Payer: Self-pay

## 2019-08-31 ENCOUNTER — Telehealth: Payer: Self-pay

## 2019-08-31 NOTE — Telephone Encounter (Signed)
recvied a fax about a refill on albuterol hfa, pt is due for a office visit as its been a year since we seen her left message to have her call us back to schedule a visit before we send in a curtesy refill

## 2019-08-31 NOTE — Telephone Encounter (Signed)
Thank you :)

## 2019-09-02 ENCOUNTER — Other Ambulatory Visit: Payer: Self-pay

## 2019-09-07 DIAGNOSIS — Z1231 Encounter for screening mammogram for malignant neoplasm of breast: Secondary | ICD-10-CM | POA: Diagnosis not present

## 2019-09-11 ENCOUNTER — Ambulatory Visit: Payer: Federal, State, Local not specified - PPO | Admitting: Orthopaedic Surgery

## 2019-09-17 ENCOUNTER — Ambulatory Visit: Payer: Federal, State, Local not specified - PPO | Admitting: Orthopaedic Surgery

## 2019-10-01 ENCOUNTER — Ambulatory Visit
Admission: EM | Admit: 2019-10-01 | Discharge: 2019-10-01 | Disposition: A | Discharge status: Home / Self Care | Payer: Federal, State, Local not specified - PPO | Attending: Emergency Medicine | Admitting: Emergency Medicine

## 2019-10-01 DIAGNOSIS — H1032 Unspecified acute conjunctivitis, left eye: Secondary | ICD-10-CM | POA: Diagnosis not present

## 2019-10-01 HISTORY — DX: Allergy, unspecified, initial encounter: T78.40XA

## 2019-10-01 MED ORDER — LUBRICANT EYE DROPS 0.4-0.3 % OP SOLN: 1.0000 [drp] | Freq: Three times a day (TID) | OPHTHALMIC | 0 refills | Status: DC | PRN

## 2019-10-01 NOTE — Discharge Instructions (Signed)
Use eyedrops 3 times daily as needed for discomfort. Remember to not let dropper touch eyes or eyelashes as this will contaminate them. Return for worsening pain, redness, swelling, difficulty seen.

## 2019-10-01 NOTE — ED Triage Notes (Signed)
Pt c/o lt eye irritation, burning and draining since woke up at 130p. States works at the [employer] and my have dirt or debris in her eye

## 2019-10-01 NOTE — ED Provider Notes (Signed)
[facility]    CSN: [id number] Arrival date & time: 10/01/19  1551      History   Chief Complaint Chief Complaint  Patient presents with  . Eye Pain    HPI Marie Kramer is a 44 y.o. female with history of allergies, asthma presenting for left eye irritation, burning, tearing since 1:30 this afternoon.  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  Denies foreign body sensation, change in vision.  Patient does wear glasses, no contact lenses.  Patient feels irritation is worse with movement and blinking.  Patient denies trauma to area.  Past Medical History:  Diagnosis Date  . Allergies   . Asthma   . Bronchitis     Patient Active Problem List   Diagnosis Date Noted  . Mild persistent asthma 07/10/2018  . Perennial and seasonal allergic rhinitis 07/10/2018  . Allergic conjunctivitis 07/10/2018  . Haglund's deformity of right heel 06/13/2018  . Achilles tendinitis, right leg 06/13/2018  . Acute midline low back pain with left-sided sciatica 07/16/2017  . Synovial cyst of left knee 01/01/2017  . Chronic pain of left knee 06/19/2016  . Left hamstring injury 06/11/2016    Past Surgical History:  Procedure Laterality Date  . TUBAL LIGATION      OB History   No obstetric history on file.      Home Medications    Prior to Admission medications   Medication Sig Start Date End Date Taking? Authorizing Provider  albuterol [facility] HFA) 108 (90 Base) MCG/ACT inhaler Use 1-2 puffs every 4-6 hours as needed 07/10/18   Bobbitt, Heywood Iles, MD  albuterol (PROVENTIL) (2.5 MG/3ML) 0.083% nebulizer solution Take 2.5 mg by nebulization every 6 (six) hours as needed for wheezing or shortness of breath.    [provider]  EPINEPHrine (EPIPEN 2-PAK) 0.3 mg/0.3 mL IJ SOAJ injection EpiPen 2-Pak 0.3 mg/0.3 mL injection, auto-injector    [provider]  fluticasone (FLOVENT HFA) 110 MCG/ACT inhaler Inhale 2 puffs into the  lungs 2 (two) times daily. 07/10/18   Bobbitt, Heywood Iles, MD  Polyethyl Glycol-Propyl Glycol (LUBRICANT EYE DROPS) 0.4-0.3 % SOLN Apply 1 drop to eye 3 (three) times daily as needed. 10/01/19   Hall-Potvin, [place], PA-C    Family History Family History  Problem Relation Age of Onset  . Allergic rhinitis Mother   . Asthma Son   . Allergic rhinitis Son   . Asthma Daughter   . Allergic rhinitis Daughter   . Bronchitis Daughter   . Asthma Son   . Allergic rhinitis Son   . Eczema Neg Hx   . Urticaria Neg Hx   . Immunodeficiency Neg Hx   . Angioedema Neg Hx     Social History Social History   Tobacco Use  . Smoking status: Never Smoker  . Smokeless tobacco: Never Used  Substance Use Topics  . Alcohol use: No  . Drug use: No     Allergies   Patient has no known allergies.   Review of Systems As per HPI   Physical Exam Triage Vital Signs ED Triage Vitals  Enc Vitals Group     BP      Pulse      Resp      Temp      Temp src      SpO2      Weight      Height      Head Circumference  Peak Flow      Pain Score      Pain Loc      Pain Edu?      Excl. in GC?    No data found.  Updated Vital Signs BP 105/66 (BP Location: Left Arm)   Pulse 69   Temp 98.1 F (36.7 C) (Oral)   Resp 16   LMP 09/22/2019   SpO2 99%   Visual Acuity Right Eye Distance: 20/30 Left Eye Distance: 20/50 Bilateral Distance: 20/40  Right Eye Near:   Left Eye Near:    Bilateral Near:     Physical Exam Constitutional:      General: She is not in acute distress.    Appearance: Normal appearance. She is not ill-appearing.  Eyes:     General: Lids are normal. Lids are everted, no foreign bodies appreciated. Vision grossly intact. Gaze aligned appropriately. No visual field deficit or scleral icterus.       Right eye: No foreign body, discharge or hordeolum.        Left eye: No foreign body, discharge or hordeolum.     Extraocular Movements: Extraocular movements intact.      Right eye: No nystagmus.     Left eye: No nystagmus.     Conjunctiva/sclera:     Right eye: Right conjunctiva is not injected. No chemosis, exudate or hemorrhage.    Left eye: Left conjunctiva is injected. Chemosis present. No exudate or hemorrhage.    Pupils: Pupils are equal, round, and reactive to light.     Comments: Fluorescein dye exam negative for uptake or foreign body.  Mild chemosis left eye with minimal injection.  Cardiovascular:     Rate and Rhythm: Normal rate.  Pulmonary:     Effort: Pulmonary effort is normal.     Breath sounds: No wheezing.  Musculoskeletal:     Cervical back: Neck supple. No tenderness.  Lymphadenopathy:     Cervical: No cervical adenopathy.  Skin:    Findings: No bruising, erythema or rash.      UC Treatments / Results  Labs (all labs ordered are listed, but only abnormal results are displayed) Labs Reviewed - No data to display  EKG   Radiology No results found.  Procedures Procedures (including critical care time)  Medications Ordered in UC Medications - No data to display  Initial Impression / Assessment and Plan / UC Course  I have reviewed the triage vital signs and the nursing notes.  Pertinent labs & imaging results that were available during my care of the patient were reviewed by me and considered in my medical decision making (see chart for details).     Patient febrile, nontoxic, and without visual changes from baseline per patient.  Floor seen dye exam unremarkable.  Patient does have diffuse conjunctivitis, likely second to irritant, possibly from work.  Will trial lubricant eyedrops for symptomatic relief and have patient follow-up with ophthalmology for further evaluation if needed.  Return precautions discussed, patient verbalized understanding and is agreeable to plan. Final Clinical Impressions(s) / UC Diagnoses   Final diagnoses:  Acute conjunctivitis of left eye, unspecified acute conjunctivitis type      Discharge Instructions     Use eyedrops 3 times daily as needed for discomfort. Remember to not let dropper touch eyes or eyelashes as this will contaminate them. Return for worsening pain, redness, swelling, difficulty seen.    ED Prescriptions    Medication Sig Dispense Auth. Provider   Polyethyl Glycol-Propyl Glycol (LUBRICANT  EYE DROPS) 0.4-0.3 % SOLN Apply 1 drop to eye 3 (three) times daily as needed. 10 mL Hall-Potvin, [place], PA-C     PDMP not reviewed this encounter.   Neldon Mc Ludlow, [place] 10/01/19 1929

## 2019-11-23 ENCOUNTER — Other Ambulatory Visit: Payer: Self-pay

## 2019-11-23 ENCOUNTER — Encounter: Payer: Self-pay | Admitting: Allergy and Immunology

## 2019-11-23 ENCOUNTER — Ambulatory Visit (INDEPENDENT_AMBULATORY_CARE_PROVIDER_SITE_OTHER): Payer: Federal, State, Local not specified - PPO | Admitting: Allergy and Immunology

## 2019-11-23 VITALS — BP 104/70 | HR 57 | Temp 97.6°F | Resp 16 | Ht 65.0 in | Wt 195.4 lb

## 2019-11-23 DIAGNOSIS — H1013 Acute atopic conjunctivitis, bilateral: Secondary | ICD-10-CM

## 2019-11-23 DIAGNOSIS — J4540 Moderate persistent asthma, uncomplicated: Secondary | ICD-10-CM

## 2019-11-23 DIAGNOSIS — J4520 Mild intermittent asthma, uncomplicated: Secondary | ICD-10-CM | POA: Diagnosis not present

## 2019-11-23 DIAGNOSIS — J3089 Other allergic rhinitis: Secondary | ICD-10-CM

## 2019-11-23 MED ORDER — FLOVENT HFA 110 MCG/ACT IN AERO
2.0000 | INHALATION_SPRAY | Freq: Two times a day (BID) | RESPIRATORY_TRACT | 5 refills | Status: DC
Start: 2019-11-23 — End: 2024-01-20

## 2019-11-23 MED ORDER — ALBUTEROL SULFATE (2.5 MG/3ML) 0.083% IN NEBU: 2.5000 mg | INHALATION_SOLUTION | Freq: Four times a day (QID) | RESPIRATORY_TRACT | 3 refills | Status: DC | PRN

## 2019-11-23 MED ORDER — MONTELUKAST SODIUM 10 MG PO TABS
10.0000 mg | ORAL_TABLET | Freq: Every day | ORAL | 5 refills | Status: DC
Start: 2019-11-23 — End: 2019-12-25

## 2019-11-23 MED ORDER — EPINEPHRINE 0.3 MG/0.3ML IJ SOAJ: INTRAMUSCULAR | 2 refills | Status: DC

## 2019-11-23 MED ORDER — OLOPATADINE HCL 0.2 % OP SOLN
1.0000 [drp] | Freq: Every day | OPHTHALMIC | 5 refills | Status: DC | PRN
Start: 2019-11-23 — End: 2020-12-28

## 2019-11-23 MED ORDER — ALBUTEROL SULFATE HFA 108 (90 BASE) MCG/ACT IN AERS: INHALATION_SPRAY | RESPIRATORY_TRACT | 3 refills | Status: DC

## 2019-11-23 NOTE — Assessment & Plan Note (Signed)
   Treatment plan as outlined above for allergic rhinitis.  A prescription has been provided for generic Pataday, one drop per eye daily as needed.  If insurance does not cover this medication, it may be purchased over-the-counter as Pataday Extra Strength.  I have also recommended eye lubricant drops (i.e., Natural Tears) as needed. 

## 2019-11-23 NOTE — Progress Notes (Signed)
Follow-up Note  RE: Marie Kramer MRN: [id number] DOB: 1976-02-21 Date of Office Visit: 11/23/2019  Primary care provider: Patient, No Pcp Per Referring provider: No ref. provider found  History of present illness: Marie Kramer is a 44 y.o. female with persistent asthma and allergic rhinoconjunctivitis presenting today for follow-up. She is previously seen in this clinic for initial evaluation in January 2020.  She reports that she has been experiencing asthma symptoms multiple times per week, typically every other day.  In addition, she has been experiencing nocturnal awakenings due to lower respiratory symptoms 2 or 3 times per week as well as occasional limitations in normal daily activities.  She reports that she never received the montelukast which was prescribed during her initial visit in January 2020. She reports that this spring she has been experiencing more frequent nasal congestion, rhinorrhea, postnasal drainage, and ocular pruritus.  She is taking Xhance and/or levocetirizine sporadically as needed and is currently not taking a medicated allergy eyedrop.  Assessment and plan: Moderate persistent asthma Currently with suboptimal control.  A prescription has been provided for Flovent (fluticasone) 110 g, 2 inhalations twice a day. To maximize pulmonary deposition, a spacer has been provided along with instructions for its proper administration with an HFA inhaler.  A prescription has been provided for montelukast 10 mg daily at bedtime.  Continue albuterol HFA, 1 to 2 inhalations every 4-6 hours if needed.  Subjective and objective measures of pulmonary function will be followed and the treatment plan will be adjusted accordingly.  Perennial and seasonal allergic rhinitis  Continue appropriate allergen avoidance measures.  I have recommended taking Xhance 2 inhalations twice daily for now.  Nasal saline spray (i.e., Simply Saline) or nasal saline lavage (i.e.,  NeilMed) is recommended as needed and prior to medicated nasal sprays.  I have recommended taking levocetirizine 5 mg daily for now.  To avoid diminishing benefit with daily use (tachyphylaxis) of second generation antihistamine, consider alternating every few months between fexofenadine (Allegra) and levocetirizine (Xyzal).  If allergen avoidance measures and medications fail to adequately relieve symptoms, aeroallergen immunotherapy will be considered.  Allergic conjunctivitis  Treatment plan as outlined above for allergic rhinitis.  A prescription has been provided for generic Pataday, one drop per eye daily as needed.  If insurance does not cover this medication, it may be purchased over-the-counter as Lawyer.  I have also recommended eye lubricant drops (i.e., Natural Tears) as needed.   Meds ordered this encounter  Medications  . albuterol (PROVENTIL) (2.5 MG/3ML) 0.083% nebulizer solution    Sig: Take 3 mLs (2.5 mg total) by nebulization every 6 (six) hours as needed for wheezing or shortness of breath.    Dispense:  75 mL    Refill:  3  . EPINEPHrine (EPIPEN 2-PAK) 0.3 mg/0.3 mL IJ SOAJ injection    Sig: EpiPen 2-Pak 0.3 mg/0.3 mL injection, auto-injector    Dispense:  1 each    Refill:  2  . albuterol (PROAIR HFA) 108 (90 Base) MCG/ACT inhaler    Sig: Use 1-2 puffs every 4-6 hours as needed    Dispense:  18 g    Refill:  3  . fluticasone (FLOVENT HFA) 110 MCG/ACT inhaler    Sig: Inhale 2 puffs into the lungs in the morning and at bedtime.    Dispense:  12 g    Refill:  5  . montelukast (SINGULAIR) 10 MG tablet    Sig: Take 1 tablet (10 mg total) by  mouth at bedtime.    Dispense:  30 tablet    Refill:  5  . Olopatadine HCl (PATADAY) 0.2 % SOLN    Sig: Place 1 drop into both eyes daily as needed.    Dispense:  2.5 mL    Refill:  5    Diagnostics: Spirometry reveals an FVC of 3.03 L (98% predicted) and an FEV1 of 2.54 L (101% predicted).  Please see  scanned spirometry results for details.    Physical examination: Blood pressure 104/70, pulse (!) 57, temperature 97.6 F (36.4 C), temperature source Temporal, resp. rate 16, height 5\' 5"  (1.651 m), weight 195 lb 6.4 oz (88.6 kg), SpO2 99 %.  General: Alert, interactive, in no acute distress. HEENT: TMs pearly gray, turbinates moderately edematous with clear discharge, post-pharynx moderately erythematous. Neck: Supple without lymphadenopathy. Lungs: Clear to auscultation without wheezing, rhonchi or rales. CV: Normal S1, S2 without murmurs. Skin: Warm and dry, without lesions or rashes.  The following portions of the patient's history were reviewed and updated as appropriate: allergies, current medications, past family history, past medical history, past social history, past surgical history and problem list.  Current Outpatient Medications  Medication Sig Dispense Refill  . albuterol (PROAIR HFA) 108 (90 Base) MCG/ACT inhaler Use 1-2 puffs every 4-6 hours as needed 18 g 3  . albuterol (PROVENTIL) (2.5 MG/3ML) 0.083% nebulizer solution Take 3 mLs (2.5 mg total) by nebulization every 6 (six) hours as needed for wheezing or shortness of breath. 75 mL 3  . EPINEPHrine (EPIPEN 2-PAK) 0.3 mg/0.3 mL IJ SOAJ injection EpiPen 2-Pak 0.3 mg/0.3 mL injection, auto-injector 1 each 2  . fluticasone (FLOVENT HFA) 110 MCG/ACT inhaler Inhale 2 puffs into the lungs 2 (two) times daily. 1 Inhaler 5  . Polyethyl Glycol-Propyl Glycol (LUBRICANT EYE DROPS) 0.4-0.3 % SOLN Apply 1 drop to eye 3 (three) times daily as needed. 10 mL 0  . fluticasone (FLOVENT HFA) 110 MCG/ACT inhaler Inhale 2 puffs into the lungs in the morning and at bedtime. 12 g 5  . montelukast (SINGULAIR) 10 MG tablet Take 1 tablet (10 mg total) by mouth at bedtime. 30 tablet 5  . Olopatadine HCl (PATADAY) 0.2 % SOLN Place 1 drop into both eyes daily as needed. 2.5 mL 5   No current facility-administered medications for this visit.    No  Known Allergies  Review of systems: Review of systems negative except as noted in HPI / PMHx.  Past Medical History:  Diagnosis Date  . Allergies   . Asthma   . Bronchitis     Family History  Problem Relation Age of Onset  . Allergic rhinitis Mother   . Asthma Son   . Allergic rhinitis Son   . Asthma Daughter   . Allergic rhinitis Daughter   . Bronchitis Daughter   . Asthma Son   . Allergic rhinitis Son   . Eczema Neg Hx   . Urticaria Neg Hx   . Immunodeficiency Neg Hx   . Angioedema Neg Hx     Social History   Socioeconomic History  . Marital status: Single    Spouse name: Not on file  . Number of children: Not on file  . Years of education: Not on file  . Highest education level: Not on file  Occupational History  . Not on file  Tobacco Use  . Smoking status: Never Smoker  . Smokeless tobacco: Never Used  Substance and Sexual Activity  . Alcohol use: No  . Drug  use: No  . Sexual activity: Yes    Birth control/protection: None, Surgical  Other Topics Concern  . Not on file  Social History Narrative  . Not on file   Social Determinants of Health   Financial Resource Strain:   . Difficulty of Paying Living Expenses:   Food Insecurity:   . Worried About Programme researcher, broadcasting/film/video in the Last Year:   . Barista in the Last Year:   Transportation Needs:   . Freight forwarder (Medical):   Marland Kitchen Lack of Transportation (Non-Medical):   Physical Activity:   . Days of Exercise per Week:   . Minutes of Exercise per Session:   Stress:   . Feeling of Stress :   Social Connections:   . Frequency of Communication with Friends and Family:   . Frequency of Social Gatherings with Friends and Family:   . Attends Religious Services:   . Active Member of Clubs or Organizations:   . Attends Banker Meetings:   Marland Kitchen Marital Status:   Intimate Partner Violence:   . Fear of Current or Ex-Partner:   . Emotionally Abused:   Marland Kitchen Physically Abused:   .  Sexually Abused:     I appreciate the opportunity to take part in Marie Kramer's care. Please do not hesitate to contact me with questions.  Sincerely,   R. Jorene Guest, MD

## 2019-11-23 NOTE — Patient Instructions (Addendum)
Moderate persistent asthma Currently with suboptimal control.  A prescription has been provided for Flovent (fluticasone) 110 g, 2 inhalations twice a day. To maximize pulmonary deposition, a spacer has been provided along with instructions for its proper administration with an HFA inhaler.  A prescription has been provided for montelukast 10 mg daily at bedtime.  Continue albuterol HFA, 1 to 2 inhalations every 4-6 hours if needed.  Subjective and objective measures of pulmonary function will be followed and the treatment plan will be adjusted accordingly.  Perennial and seasonal allergic rhinitis  Continue appropriate allergen avoidance measures.  I have recommended taking Xhance 2 inhalations twice daily for now.  Nasal saline spray (i.e., Simply Saline) or nasal saline lavage (i.e., NeilMed) is recommended as needed and prior to medicated nasal sprays.  I have recommended taking levocetirizine 5 mg daily for now.  To avoid diminishing benefit with daily use (tachyphylaxis) of second generation antihistamine, consider alternating every few months between fexofenadine (Allegra) and levocetirizine (Xyzal).  If allergen avoidance measures and medications fail to adequately relieve symptoms, aeroallergen immunotherapy will be considered.  Allergic conjunctivitis  Treatment plan as outlined above for allergic rhinitis.  A prescription has been provided for generic Pataday, one drop per eye daily as needed.  If insurance does not cover this medication, it may be purchased over-the-counter as IT trainer.  I have also recommended eye lubricant drops (i.e., Natural Tears) as needed.   Return in about 2 months (around 01/23/2020), or if symptoms worsen or fail to improve.

## 2019-11-23 NOTE — Assessment & Plan Note (Signed)
Currently with suboptimal control.  A prescription has been provided for Flovent (fluticasone) 110 g, 2 inhalations twice a day. To maximize pulmonary deposition, a spacer has been provided along with instructions for its proper administration with an HFA inhaler.  A prescription has been provided for montelukast 10 mg daily at bedtime.  Continue albuterol HFA, 1 to 2 inhalations every 4-6 hours if needed.  Subjective and objective measures of pulmonary function will be followed and the treatment plan will be adjusted accordingly.

## 2019-11-23 NOTE — Assessment & Plan Note (Signed)
   Continue appropriate allergen avoidance measures.  I have recommended taking Xhance 2 inhalations twice daily for now.  Nasal saline spray (i.e., Simply Saline) or nasal saline lavage (i.e., NeilMed) is recommended as needed and prior to medicated nasal sprays.  I have recommended taking levocetirizine 5 mg daily for now.  To avoid diminishing benefit with daily use (tachyphylaxis) of second generation antihistamine, consider alternating every few months between fexofenadine (Allegra) and levocetirizine (Xyzal).  If allergen avoidance measures and medications fail to adequately relieve symptoms, aeroallergen immunotherapy will be considered.

## 2019-11-24 ENCOUNTER — Telehealth: Payer: Self-pay | Admitting: *Deleted

## 2019-11-24 NOTE — Telephone Encounter (Signed)
FMLA form has been filled out to the best of ability and has been placed in Dr. Sheran Fava in basket for him to finish and sign.

## 2019-12-03 DIAGNOSIS — R0789 Other chest pain: Secondary | ICD-10-CM | POA: Diagnosis not present

## 2019-12-03 DIAGNOSIS — R079 Chest pain, unspecified: Secondary | ICD-10-CM | POA: Diagnosis not present

## 2019-12-03 DIAGNOSIS — R519 Headache, unspecified: Secondary | ICD-10-CM | POA: Diagnosis not present

## 2019-12-03 DIAGNOSIS — J45909 Unspecified asthma, uncomplicated: Secondary | ICD-10-CM | POA: Diagnosis not present

## 2019-12-03 DIAGNOSIS — Z8249 Family history of ischemic heart disease and other diseases of the circulatory system: Secondary | ICD-10-CM | POA: Diagnosis not present

## 2019-12-03 DIAGNOSIS — R7989 Other specified abnormal findings of blood chemistry: Secondary | ICD-10-CM | POA: Diagnosis not present

## 2019-12-04 DIAGNOSIS — R079 Chest pain, unspecified: Secondary | ICD-10-CM | POA: Diagnosis not present

## 2019-12-08 NOTE — Telephone Encounter (Signed)
Attempted to contact patient letting them know FMLA. Will make copy for [place] to scan and will place form up front for patient to collect. Unsure if we are to fax this to her job

## 2019-12-08 NOTE — Telephone Encounter (Signed)
Patient has been informed. Patient will come to pick up forms and take to her job

## 2019-12-25 ENCOUNTER — Other Ambulatory Visit: Payer: Self-pay

## 2019-12-25 ENCOUNTER — Ambulatory Visit: Payer: Federal, State, Local not specified - PPO | Admitting: Cardiology

## 2019-12-25 ENCOUNTER — Encounter: Payer: Self-pay | Admitting: Cardiology

## 2019-12-25 VITALS — BP 116/74 | HR 53 | Ht 65.0 in | Wt 194.4 lb

## 2019-12-25 DIAGNOSIS — R072 Precordial pain: Secondary | ICD-10-CM | POA: Diagnosis not present

## 2019-12-25 DIAGNOSIS — R079 Chest pain, unspecified: Secondary | ICD-10-CM

## 2019-12-25 DIAGNOSIS — Z13220 Encounter for screening for lipoid disorders: Secondary | ICD-10-CM

## 2019-12-25 NOTE — Progress Notes (Signed)
Cardiology Office Note:    Date:  12/26/2019   ID:  Marie Kramer, DOB May 14, 1976, MRN [id number]  PCP:  Patient, No Pcp Per  Cardiologist:  No primary care provider on file.  Electrophysiologist:  None   Referring MD: Donald Prose, MD   Chief Complaint  Patient presents with  . Chest Pain    History of Present Illness:    Marie Kramer is a 44 y.o. female with a hx of asthma who is referred by Dr. Nancy Fetter for evaluation of chest pain.  Presented to ED in [place] with chest pain on 12/03/2019.  EKG and troponins unremarkable.  Reports that she had onset of sharp chest pain at work that day.  States that she was walking and lifting something at the same time began having sharp pain in the center of her chest that radiated to the left side of her chest.  7 out of 10 in intensity.  Pain lasted about 1 hour.  States she does not exercise, but works at [employer] and is on her feet all day and frequently lifting things.  Does report she gets chest pain with significant exertion at work, but has occurred about once every 6 months.  Typical episodes last 15 to 30 minutes and resolve with rest.  This episode on 6/3 was notable for lasting longer and increased intensity.  Also reports she gets intermittent shortness of breath and lightheadedness.  States that she has a history of syncopal episodes, last occurred 2 years ago when she was overheated.  No smoking history.  Father died from MI in 76s.  Past Medical History:  Diagnosis Date  . Allergies   . Asthma   . Bronchitis     Past Surgical History:  Procedure Laterality Date  . TUBAL LIGATION      Current Medications: Current Meds  Medication Sig  . albuterol (PROAIR HFA) 108 (90 Base) MCG/ACT inhaler Use 1-2 puffs every 4-6 hours as needed  . albuterol (PROVENTIL) (2.5 MG/3ML) 0.083% nebulizer solution Take 3 mLs (2.5 mg total) by nebulization every 6 (six) hours as needed for wheezing or shortness of breath.  . EPINEPHrine  (EPIPEN 2-PAK) 0.3 mg/0.3 mL IJ SOAJ injection EpiPen 2-Pak 0.3 mg/0.3 mL injection, auto-injector  . fluticasone (FLOVENT HFA) 110 MCG/ACT inhaler Inhale 2 puffs into the lungs 2 (two) times daily.  . fluticasone (FLOVENT HFA) 110 MCG/ACT inhaler Inhale 2 puffs into the lungs in the morning and at bedtime.  . Olopatadine HCl (PATADAY) 0.2 % SOLN Place 1 drop into both eyes daily as needed.  Vladimir Faster Glycol-Propyl Glycol (LUBRICANT EYE DROPS) 0.4-0.3 % SOLN Apply 1 drop to eye 3 (three) times daily as needed.     Allergies:   Patient has no known allergies.   Social History   Socioeconomic History  . Marital status: Single    Spouse name: Not on file  . Number of children: Not on file  . Years of education: Not on file  . Highest education level: Not on file  Occupational History  . Not on file  Tobacco Use  . Smoking status: Never Smoker  . Smokeless tobacco: Never Used  Vaping Use  . Vaping Use: Never used  Substance and Sexual Activity  . Alcohol use: No  . Drug use: No  . Sexual activity: Yes    Birth control/protection: None, Surgical  Other Topics Concern  . Not on file  Social History Narrative  . Not on file  Social Determinants of Health   Financial Resource Strain:   . Difficulty of Paying Living Expenses:   Food Insecurity:   . Worried About Charity fundraiser in the Last Year:   . Arboriculturist in the Last Year:   Transportation Needs:   . Film/video editor (Medical):   Marland Kitchen Lack of Transportation (Non-Medical):   Physical Activity:   . Days of Exercise per Week:   . Minutes of Exercise per Session:   Stress:   . Feeling of Stress :   Social Connections:   . Frequency of Communication with Friends and Family:   . Frequency of Social Gatherings with Friends and Family:   . Attends Religious Services:   . Active Member of Clubs or Organizations:   . Attends Archivist Meetings:   Marland Kitchen Marital Status:      Family History: The  patient's family history includes Allergic rhinitis in her daughter, mother, son, and son; Asthma in her daughter, son, and son; Bronchitis in her daughter. There is no history of Eczema, Urticaria, Immunodeficiency, or Angioedema.  ROS:   Please see the history of present illness.     All other systems reviewed and are negative.  EKGs/Labs/Other Studies Reviewed:    The following studies were reviewed today:   EKG:  EKG is ordered today.  The ekg ordered today demonstrates sinus bradycardia, rate 53, no ST/T abnormalities  Recent Labs: No results found for requested labs within last 8760 hours.  Recent Lipid Panel No results found for: CHOL, TRIG, HDL, CHOLHDL, VLDL, LDLCALC, LDLDIRECT  Physical Exam:    VS:  BP 116/74   Pulse (!) 53   Ht 5' 5"  (1.651 m)   Wt 194 lb 6.4 oz (88.2 kg)   SpO2 99%   BMI 32.35 kg/m     Wt Readings from Last 3 Encounters:  12/25/19 194 lb 6.4 oz (88.2 kg)  11/23/19 195 lb 6.4 oz (88.6 kg)  07/10/18 183 lb 9.6 oz (83.3 kg)     GEN:  in no acute distress HEENT: Normal NECK: No JVD; No carotid bruits LYMPHATICS: No lymphadenopathy CARDIAC:RRR, no murmurs, rubs, gallops RESPIRATORY:  Clear to auscultation without rales, wheezing or rhonchi  ABDOMEN: Soft, non-tender, non-distended MUSCULOSKELETAL:  No edema; No deformity  SKIN: Warm and dry NEUROLOGIC:  Alert and oriented x 3 PSYCHIATRIC:  Normal affect   ASSESSMENT:    1. Chest pain of uncertain etiology   2. Precordial pain   3. Lipid screening    PLAN:    Chest pain: Atypical in description, but does occur with exertion.  CAD risk factors include family history.   -Recommend coronary CTA for further evaluation.  No rate controlling agents needed given resting heart rate in low 50s.  If she does need anything to lower heart rate on the day of her study, would favor diltiazem given her asthma history  Cardiovascular risk assessment: No recent lipid panel, will check lipids  RTC in 3  months  Medication Adjustments/Labs and Tests Ordered: Current medicines are reviewed at length with the patient today.  Concerns regarding medicines are outlined above.  Orders Placed This Encounter  Procedures  . CT CORONARY MORPH W/CTA COR W/SCORE W/CA W/CM &/OR WO/CM  . CT CORONARY FRACTIONAL FLOW RESERVE DATA PREP  . CT CORONARY FRACTIONAL FLOW RESERVE FLUID ANALYSIS  . Basic metabolic panel  . Lipid panel  . EKG 12-Lead   No orders of the defined types were placed  in this encounter.   Patient Instructions  Medication Instructions:  Continue same medications   Lab Work: Bmet and Lipid panel Have done 2 weeks before Coronary CT   Testing/Procedures: Schedule Coronary CT  Follow instructions below   Follow-Up: At [facility], you and your health needs are our priority.  As part of our continuing mission to provide you with exceptional heart care, we have created designated Provider Care Teams.  These Care Teams include your primary Cardiologist (physician) and Advanced Practice Providers (APPs -  Physician Assistants and Nurse Practitioners) who all work together to provide you with the care you need, when you need it.  We recommend signing up for the patient portal called "MyChart".  Sign up information is provided on this After Visit Summary.  MyChart is used to connect with patients for Virtual Visits (Telemedicine).  Patients are able to view lab/test results, encounter notes, upcoming appointments, etc.  Non-urgent messages can be sent to your provider as well.   To learn more about what you can do with MyChart, go to [email or web address].    Your next appointment:  3 months    The format for your next appointment: Office     Provider:  Dr.Compton Brigance       Your cardiac CT will be scheduled at one of the below locations:   [facility] [address] [place], [place] [address] [phone]  [place] [address] [place], [place] [address] [phone]  If scheduled at [facility], please arrive at the [facility] main entrance of [facility] 30 minutes prior to test start time. Proceed to the [facility] Radiology Department (first floor) to check-in and test prep.  If scheduled at [facility], please arrive 15 mins early for check-in and test prep.  Please follow these instructions carefully (unless otherwise directed):    On the Night Before the Test: . Be sure to Drink plenty of water. . Do not consume any caffeinated/decaffeinated beverages or chocolate 12 hours prior to your test. . Do not take any antihistamines 12 hours prior to your test.   On the Day of the Test: . Drink plenty of water. Do not drink any water within one hour of the test. . Do not eat any food 4 hours prior to the test. . You may take your regular medications prior to the test. . FEMALES- please wear underwire-free bra if available          After the Test: . Drink plenty of water. . After receiving IV contrast, you may experience a mild flushed feeling. This is normal. . On occasion, you may experience a mild rash up to 24 hours after the test. This is not dangerous. If this occurs, you can take Benadryl 25 mg and increase your fluid intake. . If you experience trouble breathing, this can be serious. If it is severe call 911 IMMEDIATELY. If it is mild, please call our office.    Once we have confirmed authorization from your insurance company, we will call you to set up a date and time for your test.   For non-scheduling related questions, please contact the cardiac imaging nurse navigator should you have any questions/concerns: Marchia Bond, Cardiac Imaging Nurse Navigator Burley Saver, Interim Cardiac Imaging Nurse [place] [facility] Direct Office Dial: [phone]   For scheduling needs,  including cancellations and rescheduling, please call [phone].  Signed, Donato Heinz, MD  12/26/2019 1:46 PM    [facility]

## 2019-12-25 NOTE — Patient Instructions (Addendum)
Medication Instructions:  Continue same medications   Lab Work: Bmet and Lipid panel Have done 2 weeks before Coronary CT   Testing/Procedures: Schedule Coronary CT  Follow instructions below   Follow-Up: At [facility], you and your health needs are our priority.  As part of our continuing mission to provide you with exceptional heart care, we have created designated Provider Care Teams.  These Care Teams include your primary Cardiologist (physician) and Advanced Practice Providers (APPs -  Physician Assistants and Nurse Practitioners) who all work together to provide you with the care you need, when you need it.  We recommend signing up for the patient portal called "MyChart".  Sign up information is provided on this After Visit Summary.  MyChart is used to connect with patients for Virtual Visits (Telemedicine).  Patients are able to view lab/test results, encounter notes, upcoming appointments, etc.  Non-urgent messages can be sent to your provider as well.   To learn more about what you can do with MyChart, go to [email or web address].    Your next appointment:  3 months    The format for your next appointment: Office     Provider:  Dr.Schumann       Your cardiac CT will be scheduled at one of the below locations:   [facility] [address] [place], [place] [address] [phone]  [place] [address] [place], [place] [address] [phone]  If scheduled at [facility], please arrive at the [facility] main entrance of [facility] 30 minutes prior to test start time. Proceed to the [facility] Radiology Department (first floor) to check-in and test prep.  If scheduled at [facility], please arrive 15 mins early for check-in and test prep.  Please follow these instructions carefully (unless otherwise directed):    On the Night Before the  Test: . Be sure to Drink plenty of water. . Do not consume any caffeinated/decaffeinated beverages or chocolate 12 hours prior to your test. . Do not take any antihistamines 12 hours prior to your test.   On the Day of the Test: . Drink plenty of water. Do not drink any water within one hour of the test. . Do not eat any food 4 hours prior to the test. . You may take your regular medications prior to the test. . FEMALES- please wear underwire-free bra if available          After the Test: . Drink plenty of water. . After receiving IV contrast, you may experience a mild flushed feeling. This is normal. . On occasion, you may experience a mild rash up to 24 hours after the test. This is not dangerous. If this occurs, you can take Benadryl 25 mg and increase your fluid intake. . If you experience trouble breathing, this can be serious. If it is severe call 911 IMMEDIATELY. If it is mild, please call our office.    Once we have confirmed authorization from your insurance company, we will call you to set up a date and time for your test.   For non-scheduling related questions, please contact the cardiac imaging nurse navigator should you have any questions/concerns: Marchia Bond, Cardiac Imaging Nurse Navigator Burley Saver, Interim Cardiac Imaging Nurse [place] and [facility] Services Direct Office Dial: [phone]   For scheduling needs, including cancellations and rescheduling, please call [phone].

## 2020-01-05 NOTE — Telephone Encounter (Signed)
Patient came into the office and dropped off FMLA paperwork again. Patient states her employer said the time off was not detailed enough. Patient brought in new forms to be filled out.  Please advise.

## 2020-01-06 NOTE — Telephone Encounter (Signed)
Lm for pt to call us back need fax number of place to send fmla paperwork too

## 2020-01-06 NOTE — Telephone Encounter (Signed)
fmla forms are completed and waiting on dr bobbitts signature then will fax back to employer

## 2020-01-06 NOTE — Telephone Encounter (Signed)
Noted.  Signed.  Thanks.

## 2020-01-06 NOTE — Telephone Encounter (Signed)
I am sending the forms to you in [place].

## 2020-01-08 ENCOUNTER — Telehealth (HOSPITAL_COMMUNITY): Payer: Self-pay | Admitting: *Deleted

## 2020-01-08 NOTE — Telephone Encounter (Signed)
Attempted to call patient regarding upcoming cardiac CT appointment. Left message on voicemail with name and callback number  Aalyssa Elderkin Tai RN Navigator Cardiac Imaging [place] [facility] [phone] Office [phone] Cell 

## 2020-01-11 ENCOUNTER — Ambulatory Visit (HOSPITAL_COMMUNITY)
Admission: RE | Admit: 2020-01-11 | Discharge: 2020-01-11 | Disposition: A | Discharge status: Home / Self Care | Payer: Federal, State, Local not specified - PPO | Source: Ambulatory Visit | Attending: Cardiology | Admitting: Cardiology

## 2020-01-11 ENCOUNTER — Other Ambulatory Visit: Payer: Self-pay

## 2020-01-11 DIAGNOSIS — R072 Precordial pain: Secondary | ICD-10-CM

## 2020-01-11 MED ORDER — NITROGLYCERIN 0.4 MG SL SUBL
0.8000 mg | SUBLINGUAL_TABLET | Freq: Once | SUBLINGUAL | Status: AC
  Administered 2020-01-11: 0.8 mg via SUBLINGUAL

## 2020-01-11 MED ORDER — IOHEXOL 350 MG/ML SOLN
80.0000 mL | Freq: Once | INTRAVENOUS | Status: AC | PRN
  Administered 2020-01-11: 80 mL via INTRAVENOUS

## 2020-01-11 MED ORDER — NITROGLYCERIN 0.4 MG SL SUBL
SUBLINGUAL_TABLET | SUBLINGUAL | Status: AC
  Filled 2020-01-11: qty 2

## 2020-01-12 ENCOUNTER — Other Ambulatory Visit: Payer: Self-pay | Admitting: *Deleted

## 2020-01-12 DIAGNOSIS — I2510 Atherosclerotic heart disease of native coronary artery without angina pectoris: Secondary | ICD-10-CM

## 2020-01-15 DIAGNOSIS — R079 Chest pain, unspecified: Secondary | ICD-10-CM | POA: Diagnosis not present

## 2020-01-15 DIAGNOSIS — I2510 Atherosclerotic heart disease of native coronary artery without angina pectoris: Secondary | ICD-10-CM | POA: Diagnosis not present

## 2020-01-15 LAB — BASIC METABOLIC PANEL
BUN/Creatinine Ratio: 16 (ref 9–23)
BUN: 14 mg/dL (ref 6–24)
CO2: 23 mmol/L (ref 20–29)
Calcium: 9.4 mg/dL (ref 8.7–10.2)
Chloride: 101 mmol/L (ref 96–106)
Creatinine, Ser: 0.88 mg/dL (ref 0.57–1.00)
GFR calc Af Amer: 92 mL/min/{1.73_m2} (ref >59)
GFR calc non Af Amer: 80 mL/min/{1.73_m2} (ref >59)
Glucose: 83 mg/dL (ref 65–99)
Potassium: 4.7 mmol/L (ref 3.5–5.2)
Sodium: 136 mmol/L (ref 134–144)

## 2020-01-15 LAB — LIPID PANEL
Chol/HDL Ratio: 6.4 ratio — ABNORMAL HIGH (ref 0.0–4.4)
Cholesterol, Total: 390 mg/dL — ABNORMAL HIGH (ref 100–199)
HDL: 61 mg/dL (ref >39)
LDL Chol Calc (NIH): 322 mg/dL — ABNORMAL HIGH (ref 0–99)
Triglycerides: 66 mg/dL (ref 0–149)
VLDL Cholesterol Cal: 7 mg/dL (ref 5–40)

## 2020-01-18 ENCOUNTER — Other Ambulatory Visit: Payer: Self-pay | Admitting: *Deleted

## 2020-01-18 DIAGNOSIS — E785 Hyperlipidemia, unspecified: Secondary | ICD-10-CM

## 2020-01-18 MED ORDER — ROSUVASTATIN CALCIUM 40 MG PO TABS
40.0000 mg | ORAL_TABLET | Freq: Every day | ORAL | 3 refills | Status: DC
Start: 2020-01-18 — End: 2022-10-02

## 2020-01-24 NOTE — Progress Notes (Deleted)
Follow Up Note  RE: Marie Kramer MRN: [id number] DOB: 01-12-1976 Date of Office Visit: 01/25/2020  Referring provider: No ref. provider found Primary care provider: Patient, No Pcp Per  Chief Complaint: No chief complaint on file.  History of Present Illness: I had the pleasure of seeing Marie Kramer for a follow up visit at the [facility] on 01/24/2020. She is a 44 y.o. female, who is being followed for asthma, allergic rhinoconjunctivitis. Her previous allergy office visit was on 11/24/2019 with Dr. Nunzio Cobbs. Today is a regular follow up visit.  Moderate persistent asthma Currently with suboptimal control.  A prescription has been provided for Flovent (fluticasone) 110 g, 2 inhalations twice a day. To maximize pulmonary deposition, a spacer has been provided along with instructions for its proper administration with an HFA inhaler.  A prescription has been provided for montelukast 10 mg daily at bedtime.  Continue albuterol HFA, 1 to 2 inhalations every 4-6 hours if needed.  Subjective and objective measures of pulmonary function will be followed and the treatment plan will be adjusted accordingly.  Perennial and seasonal allergic rhinitis 2020 skin testing was positive to grass, ragweed, weeds, tree, mold, cat, dog, cockroach and dust mites.  Continue appropriate allergen avoidance measures.  I have recommended taking Xhance 2 inhalations twice daily for now.  Nasal saline spray (i.e., Simply Saline) or nasal saline lavage (i.e., NeilMed) is recommended as needed and prior to medicated nasal sprays.  I have recommended taking levocetirizine 5 mg daily for now.  To avoid diminishing benefit with daily use (tachyphylaxis) of second generation antihistamine, consider alternating every few months between fexofenadine (Allegra) and levocetirizine (Xyzal).  If allergen avoidance measures and medications fail to adequately relieve symptoms, aeroallergen  immunotherapy will be considered.  Allergic conjunctivitis  Treatment plan as outlined above for allergic rhinitis.  A prescription has been provided for generic Pataday, one drop per eye daily as needed.  If insurance does not cover this medication, it may be purchased over-the-counter as IT trainer.  I have also recommended eye lubricant drops (i.e., Natural Tears) as needed.  Assessment and Plan: Marie Kramer is a 44 y.o. female with: No problem-specific Assessment & Plan notes found for this encounter.  No follow-ups on file.  No orders of the defined types were placed in this encounter.  Lab Orders  No laboratory test(s) ordered today    Diagnostics: Spirometry:  Tracings reviewed. Her effort: {Blank single:19197::"Good reproducible efforts.","It was hard to get consistent efforts and there is a question as to whether this reflects a maximal maneuver.","Poor effort, data can not be interpreted."} FVC: ***L FEV1: ***L, ***% predicted FEV1/FVC ratio: ***% Interpretation: {Blank single:19197::"Spirometry consistent with mild obstructive disease","Spirometry consistent with moderate obstructive disease","Spirometry consistent with severe obstructive disease","Spirometry consistent with possible restrictive disease","Spirometry consistent with mixed obstructive and restrictive disease","Spirometry uninterpretable due to technique","Spirometry consistent with normal pattern","No overt abnormalities noted given today's efforts"}.  Please see scanned spirometry results for details.  Skin Testing: {Blank single:19197::"Select foods","Environmental allergy panel","Environmental allergy panel and select foods","Food allergy panel","None","Deferred due to recent antihistamines use"}. Positive test to: ***. Negative test to: ***.  Results discussed with patient/family.   Medication List:  Current Outpatient Medications  Medication Sig Dispense Refill  . albuterol (PROAIR HFA)  108 (90 Base) MCG/ACT inhaler Use 1-2 puffs every 4-6 hours as needed 18 g 3  . albuterol (PROVENTIL) (2.5 MG/3ML) 0.083% nebulizer solution Take 3 mLs (2.5 mg total) by nebulization every 6 (six) hours  as needed for wheezing or shortness of breath. 75 mL 3  . EPINEPHrine (EPIPEN 2-PAK) 0.3 mg/0.3 mL IJ SOAJ injection EpiPen 2-Pak 0.3 mg/0.3 mL injection, auto-injector 1 each 2  . fluticasone (FLOVENT HFA) 110 MCG/ACT inhaler Inhale 2 puffs into the lungs 2 (two) times daily. 1 Inhaler 5  . fluticasone (FLOVENT HFA) 110 MCG/ACT inhaler Inhale 2 puffs into the lungs in the morning and at bedtime. 12 g 5  . Olopatadine HCl (PATADAY) 0.2 % SOLN Place 1 drop into both eyes daily as needed. 2.5 mL 5  . Polyethyl Glycol-Propyl Glycol (LUBRICANT EYE DROPS) 0.4-0.3 % SOLN Apply 1 drop to eye 3 (three) times daily as needed. 10 mL 0  . rosuvastatin (CRESTOR) 40 MG tablet Take 1 tablet (40 mg total) by mouth daily. 90 tablet 3   No current facility-administered medications for this visit.   Allergies: No Known Allergies I reviewed her past medical history, social history, family history, and environmental history and no significant changes have been reported from her previous visit.  Review of Systems  Constitutional: Negative for appetite change, chills, fever and unexpected weight change.  HENT: Negative for congestion and rhinorrhea.   Eyes: Negative for itching.  Respiratory: Negative for cough, chest tightness, shortness of breath and wheezing.   Cardiovascular: Negative for chest pain.  Gastrointestinal: Negative for abdominal pain.  Genitourinary: Negative for difficulty urinating.  Skin: Negative for rash.  Allergic/Immunologic: Positive for environmental allergies.  Neurological: Negative for headaches.   Objective: There were no vitals taken for this visit. There is no height or weight on file to calculate BMI. Physical Exam Vitals and nursing note reviewed.  Constitutional:       Appearance: Normal appearance. She is well-developed.  HENT:     Head: Normocephalic and atraumatic.     Right Ear: External ear normal.     Left Ear: External ear normal.     Nose: Nose normal.     Mouth/Throat:     Mouth: Mucous membranes are moist.     Pharynx: Oropharynx is clear.  Eyes:     Conjunctiva/sclera: Conjunctivae normal.  Cardiovascular:     Rate and Rhythm: Normal rate and regular rhythm.     Heart sounds: Normal heart sounds. No murmur heard.  No friction rub. No gallop.   Pulmonary:     Effort: Pulmonary effort is normal.     Breath sounds: Normal breath sounds. No wheezing, rhonchi or rales.  Abdominal:     Palpations: Abdomen is soft.  Musculoskeletal:     Cervical back: Neck supple.  Skin:    General: Skin is warm.     Findings: No rash.  Neurological:     Mental Status: She is alert and oriented to person, place, and time.  Psychiatric:        Behavior: Behavior normal.    Previous notes and tests were reviewed. The plan was reviewed with the patient/family, and all questions/concerned were addressed.  It was my pleasure to see Skyah today and participate in her care. Please feel free to contact me with any questions or concerns.  Sincerely,  Wyline Mood, DO Allergy & Immunology  [facility] office: [phone] [facility] office: [phone] [place] office: [phone]

## 2020-01-25 ENCOUNTER — Ambulatory Visit: Payer: Federal, State, Local not specified - PPO | Admitting: Allergy

## 2020-02-16 ENCOUNTER — Other Ambulatory Visit (HOSPITAL_COMMUNITY)
Admission: RE | Admit: 2020-02-16 | Discharge: 2020-02-16 | Disposition: A | Discharge status: Home / Self Care | Payer: Federal, State, Local not specified - PPO | Source: Ambulatory Visit | Attending: Family Medicine | Admitting: Family Medicine

## 2020-02-16 ENCOUNTER — Other Ambulatory Visit: Payer: Self-pay | Admitting: Family Medicine

## 2020-02-16 DIAGNOSIS — Z0000 Encounter for general adult medical examination without abnormal findings: Secondary | ICD-10-CM | POA: Diagnosis not present

## 2020-02-16 DIAGNOSIS — Z13220 Encounter for screening for lipoid disorders: Secondary | ICD-10-CM | POA: Diagnosis not present

## 2020-02-16 DIAGNOSIS — J4520 Mild intermittent asthma, uncomplicated: Secondary | ICD-10-CM | POA: Diagnosis not present

## 2020-02-16 DIAGNOSIS — R040 Epistaxis: Secondary | ICD-10-CM | POA: Diagnosis not present

## 2020-02-16 DIAGNOSIS — Z23 Encounter for immunization: Secondary | ICD-10-CM | POA: Diagnosis not present

## 2020-02-17 LAB — CYTOLOGY - PAP
Adequacy: ABSENT
Comment: NEGATIVE
Diagnosis: NEGATIVE
High risk HPV: NEGATIVE

## 2020-04-05 NOTE — Progress Notes (Deleted)
Cardiology Office Note:    Date:  04/05/2020   ID:  Marie Kramer, DOB 1976/01/06, MRN [id number]  PCP:  Patient, No Pcp Per  Cardiologist:  No primary care provider on file.  Electrophysiologist:  None   Referring MD: No ref. provider found   No chief complaint on file.   History of Present Illness:    Marie Kramer is a 44 y.o. female with a hx of asthma who presents for follow-up.  She was referred by Dr. Wynelle Link for evaluation of chest pain, initially seen on 12/25/2019.  Presented to ED in [place] with chest pain on 12/03/2019.  EKG and troponins unremarkable.  Reports that she had onset of sharp chest pain at work that day.  States that she was walking and lifting something at the same time began having sharp pain in the center of her chest that radiated to the left side of her chest.  7 out of 10 in intensity.  Pain lasted about 1 hour.  States she does not exercise, but works at [employer] and is on her feet all day and frequently lifting things.  Does report she gets chest pain with significant exertion at work, but has occurred about once every 6 months.  Typical episodes last 15 to 30 minutes and resolve with rest.  This episode on 6/3 was notable for lasting longer and increased intensity.  Also reports she gets intermittent shortness of breath and lightheadedness.  States that she has a history of syncopal episodes, last occurred 2 years ago when she was overheated.  No smoking history.  Father died from MI in 32s.  Coronary CTA on 01/11/20 showed nonobstructive CAD with calcified plaque in the distal left main/ostial LAD causing minimal (0 to 24%) stenosis; calcium score 7 (94th percentile).  Lipid panel on 01/15/2020 showed LDL 322, started on rosuvastatin 40 mg daily.  Since last clinic visit,  Past Medical History:  Diagnosis Date  . Allergies   . Asthma   . Bronchitis     Past Surgical History:  Procedure Laterality Date  . TUBAL LIGATION      Current  Medications: No outpatient medications have been marked as taking for the 04/06/20 encounter (Appointment) with Little Ishikawa, MD.     Allergies:   Patient has no known allergies.   Social History   Socioeconomic History  . Marital status: Single    Spouse name: Not on file  . Number of children: Not on file  . Years of education: Not on file  . Highest education level: Not on file  Occupational History  . Not on file  Tobacco Use  . Smoking status: Never Smoker  . Smokeless tobacco: Never Used  Vaping Use  . Vaping Use: Never used  Substance and Sexual Activity  . Alcohol use: No  . Drug use: No  . Sexual activity: Yes    Birth control/protection: None, Surgical  Other Topics Concern  . Not on file  Social History Narrative  . Not on file   Social Determinants of Health   Financial Resource Strain:   . Difficulty of Paying Living Expenses: Not on file  Food Insecurity:   . Worried About Programme researcher, broadcasting/film/video in the Last Year: Not on file  . Ran Out of Food in the Last Year: Not on file  Transportation Needs:   . Lack of Transportation (Medical): Not on file  . Lack of Transportation (Non-Medical): Not on file  Physical Activity:   .  Days of Exercise per Week: Not on file  . Minutes of Exercise per Session: Not on file  Stress:   . Feeling of Stress : Not on file  Social Connections:   . Frequency of Communication with Friends and Family: Not on file  . Frequency of Social Gatherings with Friends and Family: Not on file  . Attends Religious Services: Not on file  . Active Member of Clubs or Organizations: Not on file  . Attends Banker Meetings: Not on file  . Marital Status: Not on file     Family History: The patient's family history includes Allergic rhinitis in her daughter, mother, son, and son; Asthma in her daughter, son, and son; Bronchitis in her daughter. There is no history of Eczema, Urticaria, Immunodeficiency, or  Angioedema.  ROS:   Please see the history of present illness.     All other systems reviewed and are negative.  EKGs/Labs/Other Studies Reviewed:    The following studies were reviewed today:   EKG:  EKG is ordered today.  The ekg ordered today demonstrates sinus bradycardia, rate 53, no ST/T abnormalities  Recent Labs: 01/15/2020: BUN 14; Creatinine, Ser 0.88; Potassium 4.7; Sodium 136  Recent Lipid Panel    Component Value Date/Time   CHOL 390 (H) 01/15/2020 1103   TRIG 66 01/15/2020 1103   HDL 61 01/15/2020 1103   CHOLHDL 6.4 (H) 01/15/2020 1103   LDLCALC 322 (H) 01/15/2020 1103    Physical Exam:    VS:  There were no vitals taken for this visit.    Wt Readings from Last 3 Encounters:  12/25/19 194 lb 6.4 oz (88.2 kg)  11/23/19 195 lb 6.4 oz (88.6 kg)  07/10/18 183 lb 9.6 oz (83.3 kg)     GEN:  in no acute distress HEENT: Normal NECK: No JVD; No carotid bruits LYMPHATICS: No lymphadenopathy CARDIAC:RRR, no murmurs, rubs, gallops RESPIRATORY:  Clear to auscultation without rales, wheezing or rhonchi  ABDOMEN: Soft, non-tender, non-distended MUSCULOSKELETAL:  No edema; No deformity  SKIN: Warm and dry NEUROLOGIC:  Alert and oriented x 3 PSYCHIATRIC:  Normal affect   ASSESSMENT:    No diagnosis found. PLAN:    CAD: Reported atypical chest pain.  Coronary CTA on 01/11/20 showed nonobstructive CAD with calcified plaque in the distal left main/ostial LAD causing minimal (0 to 24%) stenosis; calcium score 7 (94th percentile).  -Start rosuvastatin 40 mg daily  Hyperlipidemia: LDL 322 on 01/15/2020.  Started on rosuvastatin 40 mg daily.  CMP/THS ordered to evaluate for secondary causes of hyperlipidemia  RTC in***  Medication Adjustments/Labs and Tests Ordered: Current medicines are reviewed at length with the patient today.  Concerns regarding medicines are outlined above.  No orders of the defined types were placed in this encounter.  No orders of the defined  types were placed in this encounter.   There are no Patient Instructions on file for this visit.   Signed, Little Ishikawa, MD  04/05/2020 12:54 PM    [facility]

## 2020-04-06 ENCOUNTER — Ambulatory Visit: Payer: Federal, State, Local not specified - PPO | Admitting: Cardiology

## 2020-04-22 DIAGNOSIS — E785 Hyperlipidemia, unspecified: Secondary | ICD-10-CM | POA: Diagnosis not present

## 2020-06-15 DIAGNOSIS — R051 Acute cough: Secondary | ICD-10-CM | POA: Diagnosis not present

## 2020-06-15 DIAGNOSIS — Z20828 Contact with and (suspected) exposure to other viral communicable diseases: Secondary | ICD-10-CM | POA: Diagnosis not present

## 2020-06-15 DIAGNOSIS — R0981 Nasal congestion: Secondary | ICD-10-CM | POA: Diagnosis not present

## 2020-06-20 ENCOUNTER — Emergency Department (HOSPITAL_COMMUNITY)
Admission: EM | Admit: 2020-06-20 | Discharge: 2020-06-20 | Disposition: A | Discharge status: Home / Self Care | Payer: Federal, State, Local not specified - PPO | Attending: Emergency Medicine | Admitting: Emergency Medicine

## 2020-06-20 ENCOUNTER — Encounter (HOSPITAL_COMMUNITY): Payer: Self-pay

## 2020-06-20 ENCOUNTER — Other Ambulatory Visit: Payer: Self-pay

## 2020-06-20 DIAGNOSIS — R682 Dry mouth, unspecified: Secondary | ICD-10-CM | POA: Insufficient documentation

## 2020-06-20 DIAGNOSIS — J4540 Moderate persistent asthma, uncomplicated: Secondary | ICD-10-CM | POA: Diagnosis not present

## 2020-06-20 DIAGNOSIS — Z7951 Long term (current) use of inhaled steroids: Secondary | ICD-10-CM | POA: Diagnosis not present

## 2020-06-20 DIAGNOSIS — K1379 Other lesions of oral mucosa: Secondary | ICD-10-CM | POA: Diagnosis not present

## 2020-06-20 NOTE — Discharge Instructions (Addendum)
Follow up with your doctor as scheduled for further evaluation of dryness of mouth. Recommend hydrating in any way that you can or that appeals to you - eating ice, popsicles, foods that have a high water content.

## 2020-06-20 NOTE — ED Triage Notes (Signed)
Patient arrived stating over the last week her mouth has "felt like cotton" no other complaints at this time. States it is not painful.

## 2020-06-20 NOTE — ED Provider Notes (Signed)
[facility]-EMERGENCY DEPT Provider Note   CSN: [id number] Arrival date & time: 06/20/20  0159     History Chief Complaint  Patient presents with  . Dry Mouth     Marie Kramer is a 44 y.o. female.  Patient with a history of asthma presents with dry mouth for the past one week. No new medications, illness, fever, vomiting. She states she hasn't been able to eat or drink because foods "don't taste right". No loss of taste or smell. No fever.   The history is provided by the patient. No language interpreter was used.       Past Medical History:  Diagnosis Date  . Allergies   . Asthma   . Bronchitis     Patient Active Problem List   Diagnosis Date Noted  . Moderate persistent asthma 07/10/2018  . Perennial and seasonal allergic rhinitis 07/10/2018  . Seasonal and perennial allergic rhinoconjunctivitis 07/10/2018  . Haglund's deformity of right heel 06/13/2018  . Achilles tendinitis, right leg 06/13/2018  . Acute midline low back pain with left-sided sciatica 07/16/2017  . Synovial cyst of left knee 01/01/2017  . Chronic pain of left knee 06/19/2016  . Left hamstring injury 06/11/2016    Past Surgical History:  Procedure Laterality Date  . TUBAL LIGATION       OB History   No obstetric history on file.     Family History  Problem Relation Age of Onset  . Allergic rhinitis Mother   . Asthma Son   . Allergic rhinitis Son   . Asthma Daughter   . Allergic rhinitis Daughter   . Bronchitis Daughter   . Asthma Son   . Allergic rhinitis Son   . Eczema Neg Hx   . Urticaria Neg Hx   . Immunodeficiency Neg Hx   . Angioedema Neg Hx     Social History   Tobacco Use  . Smoking status: Never Smoker  . Smokeless tobacco: Never Used  Vaping Use  . Vaping Use: Never used  Substance Use Topics  . Alcohol use: No  . Drug use: No    Home Medications Prior to Admission medications   Medication Sig Start Date End Date Taking? Authorizing  Provider  albuterol [facility] Xxx HFA) 108 (90 Base) MCG/ACT inhaler Use 1-2 puffs every 4-6 hours as needed 11/23/19   Bobbitt, Heywood Iles, MD  albuterol (PROVENTIL) (2.5 MG/3ML) 0.083% nebulizer solution Take 3 mLs (2.5 mg total) by nebulization every 6 (six) hours as needed for wheezing or shortness of breath. 11/23/19   Bobbitt, Heywood Iles, MD  EPINEPHrine (EPIPEN 2-PAK) 0.3 mg/0.3 mL IJ SOAJ injection EpiPen 2-Pak 0.3 mg/0.3 mL injection, auto-injector 11/23/19   Bobbitt, Heywood Iles, MD  fluticasone (FLOVENT HFA) 110 MCG/ACT inhaler Inhale 2 puffs into the lungs 2 (two) times daily. 07/10/18   Bobbitt, Heywood Iles, MD  fluticasone (FLOVENT HFA) 110 MCG/ACT inhaler Inhale 2 puffs into the lungs in the morning and at bedtime. 11/23/19   Bobbitt, Heywood Iles, MD  Olopatadine HCl (PATADAY) 0.2 % SOLN Place 1 drop into both eyes daily as needed. 11/23/19   Bobbitt, Heywood Iles, MD  Polyethyl Glycol-Propyl Glycol (LUBRICANT EYE DROPS) 0.4-0.3 % SOLN Apply 1 drop to eye 3 (three) times daily as needed. 10/01/19   Hall-Potvin, [place], PA-C  rosuvastatin (CRESTOR) 40 MG tablet Take 1 tablet (40 mg total) by mouth daily. 01/18/20 04/17/20  Little Ishikawa, MD    Allergies    Patient has no known  allergies.  Review of Systems   Review of Systems  Constitutional: Negative for fever.  HENT: Negative for sore throat and trouble swallowing.        See HPI.  Respiratory: Negative for shortness of breath.   Gastrointestinal: Negative for nausea and vomiting.  Skin: Negative for color change.  Neurological: Negative for weakness.    Physical Exam Updated Vital Signs BP 114/80 (BP Location: Left Arm)   Pulse (!) 105   Temp 99.8 F (37.7 C) (Oral)   Resp 20   SpO2 96%   Physical Exam Vitals and nursing note reviewed.  Constitutional:      Appearance: She is well-developed and well-nourished.  HENT:     Nose: Nose normal.     Mouth/Throat:     Mouth: Mucous membranes are dry.      Pharynx: No oropharyngeal exudate or posterior oropharyngeal erythema.  Eyes:     Conjunctiva/sclera: Conjunctivae normal.  Pulmonary:     Effort: Pulmonary effort is normal.  Musculoskeletal:     Cervical back: Normal range of motion.  Skin:    General: Skin is warm and dry.  Neurological:     Mental Status: She is alert and oriented to person, place, and time.     ED Results / Procedures / Treatments   Labs (all labs ordered are listed, but only abnormal results are displayed) Labs Reviewed - No data to display  EKG None  Radiology No results found.  Procedures Procedures (including critical care time)  Medications Ordered in ED Medications - No data to display  ED Course  I have reviewed the triage vital signs and the nursing notes.  Pertinent labs & imaging results that were available during my care of the patient were reviewed by me and considered in my medical decision making (see chart for details).    MDM Rules/Calculators/A&P                          Patient to emergency department with symptoms of dry mouth.   No identifiable cause of symptoms found. She has report of decreased PO intake for a week. She is encourage to hydrate. Follow up as scheduled with PCP.   Final Clinical Impression(s) / ED Diagnoses Final diagnoses:  None   1. Dry mouth  Rx / DC Orders ED Discharge Orders    None       Danne Harbor 06/20/20 0234    Sabas Sous, MD 06/20/20 0500

## 2020-08-02 DIAGNOSIS — Z01812 Encounter for preprocedural laboratory examination: Secondary | ICD-10-CM | POA: Diagnosis not present

## 2020-08-04 DIAGNOSIS — Z1211 Encounter for screening for malignant neoplasm of colon: Secondary | ICD-10-CM | POA: Diagnosis not present

## 2020-08-04 DIAGNOSIS — D123 Benign neoplasm of transverse colon: Secondary | ICD-10-CM | POA: Diagnosis not present

## 2020-08-04 DIAGNOSIS — K6389 Other specified diseases of intestine: Secondary | ICD-10-CM | POA: Diagnosis not present

## 2020-08-04 DIAGNOSIS — K648 Other hemorrhoids: Secondary | ICD-10-CM | POA: Diagnosis not present

## 2020-10-25 ENCOUNTER — Other Ambulatory Visit: Payer: Self-pay

## 2020-10-25 ENCOUNTER — Ambulatory Visit (INDEPENDENT_AMBULATORY_CARE_PROVIDER_SITE_OTHER): Payer: Federal, State, Local not specified - PPO | Admitting: Allergy

## 2020-10-25 ENCOUNTER — Encounter: Payer: Self-pay | Admitting: Allergy

## 2020-10-25 VITALS — BP 116/64 | HR 68 | Temp 97.1°F | Resp 16 | Ht 67.0 in | Wt 192.0 lb

## 2020-10-25 DIAGNOSIS — J4540 Moderate persistent asthma, uncomplicated: Secondary | ICD-10-CM | POA: Diagnosis not present

## 2020-10-25 DIAGNOSIS — J3089 Other allergic rhinitis: Secondary | ICD-10-CM | POA: Diagnosis not present

## 2020-10-25 DIAGNOSIS — H1010 Acute atopic conjunctivitis, unspecified eye: Secondary | ICD-10-CM

## 2020-10-25 DIAGNOSIS — J302 Other seasonal allergic rhinitis: Secondary | ICD-10-CM | POA: Diagnosis not present

## 2020-10-25 DIAGNOSIS — H1013 Acute atopic conjunctivitis, bilateral: Secondary | ICD-10-CM | POA: Insufficient documentation

## 2020-10-25 MED ORDER — FLUTICASONE PROPIONATE 50 MCG/ACT NA SUSP: 1.0000 | Freq: Two times a day (BID) | NASAL | 5 refills | Status: DC | PRN

## 2020-10-25 NOTE — Patient Instructions (Addendum)
Today's skin testing showed: Positive to grass and dust mites.   Environmental allergies  Start environmental control measures as below.  May use over the counter antihistamines such as Xyzal (levocetirizine) daily.  May use Flonase (fluticasone) nasal spray 1 spray per nostril twice a day as needed for nasal congestion.   Nasal saline spray (i.e., Simply Saline) or nasal saline lavage (i.e., NeilMed) is recommended as needed and prior to medicated nasal sprays.  May use olopatadine eye drops 0.2% once a day as needed for itchy/watery eyes.  Consider allergy injections for long term control if above medications do not help the symptoms - handout given.   Breathing: . Daily controller medication(s): START Flovent 2 puffs twice a day with spacer and rinse mouth afterwards. . May use albuterol rescue inhaler 2 puffs every 4 to 6 hours as needed for shortness of breath, chest tightness, coughing, and wheezing. May use albuterol rescue inhaler 2 puffs 5 to 15 minutes prior to strenuous physical activities. Monitor frequency of use.  . Asthma control goals:  o Full participation in all desired activities (may need albuterol before activity) o Albuterol use two times or less a week on average (not counting use with activity) o Cough interfering with sleep two times or less a month o Oral steroids no more than once a year o No hospitalizations  Follow up in 2 months or sooner if needed.   Control of House Dust Mite Allergen . Dust mite allergens are a common trigger of allergy and asthma symptoms. While they can be found throughout the house, these microscopic creatures thrive in warm, humid environments such as bedding, upholstered furniture and carpeting. . Because so much time is spent in the bedroom, it is essential to reduce mite levels there.  . Encase pillows, mattresses, and box springs in special allergen-proof fabric covers or airtight, zippered plastic covers.  . Bedding  should be washed weekly in hot water (130 F) and dried in a hot dryer. Allergen-proof covers are available for comforters and pillows that can't be regularly washed.  Reyes Ivan the allergy-proof covers every few months. Minimize clutter in the bedroom. Keep pets out of the bedroom.  Marland Kitchen Keep humidity less than 50% by using a dehumidifier or air conditioning. You can buy a humidity measuring device called a hygrometer to monitor this.  . If possible, replace carpets with hardwood, linoleum, or washable area rugs. If that's not possible, vacuum frequently with a vacuum that has a HEPA filter. . Remove all upholstered furniture and non-washable window drapes from the bedroom. . Remove all non-washable stuffed toys from the bedroom.  Wash stuffed toys weekly. Control of House Dust Mite Allergen . Dust mite allergens are a common trigger of allergy and asthma symptoms. While they can be found throughout the house, these microscopic creatures thrive in warm, humid environments such as bedding, upholstered furniture and carpeting. . Because so much time is spent in the bedroom, it is essential to reduce mite levels there.  . Encase pillows, mattresses, and box springs in special allergen-proof fabric covers or airtight, zippered plastic covers.  . Bedding should be washed weekly in hot water (130 F) and dried in a hot dryer. Allergen-proof covers are available for comforters and pillows that can't be regularly washed.  Reyes Ivan the allergy-proof covers every few months. Minimize clutter in the bedroom. Keep pets out of the bedroom.  Marland Kitchen Keep humidity less than 50% by using a dehumidifier or air conditioning. You can buy  a humidity measuring device called a hygrometer to monitor this.  . If possible, replace carpets with hardwood, linoleum, or washable area rugs. If that's not possible, vacuum frequently with a vacuum that has a HEPA filter. . Remove all upholstered furniture and non-washable window drapes from the  bedroom. . Remove all non-washable stuffed toys from the bedroom.  Wash stuffed toys weekly.

## 2020-10-25 NOTE — Assessment & Plan Note (Signed)
Diagnosed with asthma as a child and using albuterol 2-4 times per week with some benefit.  Used to be on Flovent and Singulair but not taking a regular basis.  ACT score 15.  Today's spirometry was normal with no improvement in FEV1 post bronchodilator treatment.  Clinically feeling improved. . Daily controller medication(s): START Flovent 2 puffs twice a day with spacer and rinse mouth afterwards. . May use albuterol rescue inhaler 2 puffs every 4 to 6 hours as needed for shortness of breath, chest tightness, coughing, and wheezing. May use albuterol rescue inhaler 2 puffs 5 to 15 minutes prior to strenuous physical activities. Monitor frequency of use.  . Repeat spirometry at next visit.

## 2020-10-25 NOTE — Progress Notes (Signed)
Follow up Note  RE: Marie Kramer MRN: [id number] DOB: [birth year] Date of Office Visit: 10/25/2020  Consult requested by: Tally Joe, MD Primary care provider: Tally Joe, MD  Chief Complaint: Asthma  History of Present Illness: I had the pleasure of seeing Marie Kramer for initial evaluation at the [facility] on 10/25/2020. She is a 45 y.o. female, who is self-referred here for the evaluation of asthma. Patient is a poor historian.  Patient does not remember seeing Dr. Nunzio Cobbs in 2021 and 2020.  She was last seen in our office in 2021 for asthma and allergic rhino conjunctivitis.  Confirmed with patient date of birth and address to make sure the right patient file was opened up and she confirmed that they were both correct.   Breathing: ACT score 15.  She reports symptoms of chest tightness, shortness of breath, coughing, wheezing, nocturnal awakenings for 30+ years. Current medications include albuterol HFA and neb  which help. She reports using aerochamber with inhalers. She tried the following inhalers: Flovent. Main triggers are dust, pollen. In the last month, frequency of symptoms: 2-4x/week. Frequency of nocturnal symptoms: 0x/month. Frequency of SABA use: 2-4x/week. Interference with physical activity: sometimes. Sleep is undisturbed. In the last 12 months, emergency room visits/urgent care visits/doctor office visits or hospitalizations due to respiratory issues: 3-4. In the last 12 months, oral steroids courses: 0. Lifetime history of hospitalization for respiratory issues: no. Prior intubations: no. History of pneumonia: no. She was not evaluated by allergist/pulmonologist in the past. Smoking exposure: no. Up to date with flu vaccine: no. Up to date with COVID-19 vaccine: yes. Prior Covid-19 infection: yes in January 2022. History of reflux: no.  Assessment and Plan: Marie Kramer is a 45 y.o. female with: Moderate persistent asthma Diagnosed with  asthma as a child and using albuterol 2-4 times per week with some benefit.  Used to be on Flovent and Singulair but not taking a regular basis.  ACT score 15.  Today's spirometry was normal with no improvement in FEV1 post bronchodilator treatment.  Clinically feeling improved. . Daily controller medication(s): START Flovent 2 puffs twice a day with spacer and rinse mouth afterwards. . May use albuterol rescue inhaler 2 puffs every 4 to 6 hours as needed for shortness of breath, chest tightness, coughing, and wheezing. May use albuterol rescue inhaler 2 puffs 5 to 15 minutes prior to strenuous physical activities. Monitor frequency of use.  . Repeat spirometry at next visit.  Seasonal and perennial allergic rhinoconjunctivitis Rhinoconjunctivitis symptoms for the last couple years mainly from spring through fall.  Tried Zyrtec and Xyzal with some benefit.  Patient does not recall being skin testing but records showed that 2020 skin testing was positive to grass, mold, ragweed, weed, tree, cat, dog, cockroach and dust mites.  Today's skin testing showed: Positive to grass and dust mites.   Start environmental control measures as below.  May use over the counter antihistamines such as Xyzal (levocetirizine) daily.  May use Flonase (fluticasone) nasal spray 1 spray per nostril twice a day as needed for nasal congestion.   Nasal saline spray (i.e., Simply Saline) or nasal saline lavage (i.e., NeilMed) is recommended as needed and prior to medicated nasal sprays.  May use olopatadine eye drops 0.2% once a day as needed for itchy/watery eyes.  Consider allergy injections for long term control if above medications do not help the symptoms - handout given.   Return in about 2 months (around 12/25/2020).  Meds ordered this encounter  Medications  . fluticasone (FLONASE) 50 MCG/ACT nasal spray    Sig: Place 1 spray into both nostrils 2 (two) times daily as needed for rhinitis.     Dispense:  16 g    Refill:  5   Lab Orders  No laboratory test(s) ordered today    Other allergy screening: Rhino conjunctivitis: yes  She reports symptoms of nasal congestion, rhinorrhea, itchy/watery eyes, epistaxis, headaches. Symptoms have been going on for 2-3 years. The symptoms are present from sprint through fall. Anosmia: no. Headache: yes. She has used zyrtec and Xyzal with some improvement in symptoms. Sinus infections: one. Previous work up includes: patient does not recall being skin tested but records show that she had skin prick testing at our office in 2020 which was positive to grass, mold, ragweed, weed, tree, cat, dog, cockroach and dust mites. Previous ENT evaluation: adenoidectomy. Previous sinus imaging: no. History of nasal polyps: no. Last eye exam: a few years ago.  Food allergy: no Medication allergy: no Hymenoptera allergy: no Urticaria: when in contact with the grass. Eczema:no History of recurrent infections suggestive of immunodeficency: no  Diagnostics: Spirometry:  Tracings reviewed. Her effort: Good reproducible efforts. FVC: 2.74L FEV1: 2.24L, 83% predicted FEV1/FVC ratio: 82% Interpretation: Spirometry consistent with normal pattern with 0% improvement in FEV1 post bronchodilator treatment. Clinically feeling improved.   Please see scanned spirometry results for details.  Skin Testing: Environmental allergy panel. Positive to grass and dust mites.  Results discussed with patient/family.  Airborne Adult Perc - 10/25/20 0955    Time Antigen Placed 0955    Allergen Manufacturer Waynette ButteryGreer    Location Back    Number of Test 59    1. Control-Buffer 50% Glycerol Negative    2. Control-Histamine 1 mg/ml 2+    3. Albumin saline Negative    4. Bahia Negative    5. French Southern Territories[place]ermuda Negative    6. Johnson Negative    7. [place] Blue Negative    8. Meadow Fescue Negative    9. Perennial Rye Negative    10. Sweet Vernal Negative    11. Timothy 2+    12.  Cocklebur Negative    13. Burweed Marshelder Negative    14. Ragweed, short Negative    15. Ragweed, Giant Negative    16. Plantain,  English Negative    17. Lamb's Quarters Negative    18. Sheep Sorrell Negative    19. Rough Pigweed Negative    20. Marsh Elder, Rough Negative    21. Mugwort, Common Negative    22. Ash mix Negative    23. Birch mix Negative    24. Beech American Negative    25. Box, Elder Negative    26. Cedar, red Negative    27. Cottonwood, Guinea-Bissau[place]astern Negative    28. Elm mix Negative    29. Hickory Negative    30. Maple mix Negative    31. Oak, Guinea-Bissau[place]astern mix Negative    32. Pecan Pollen Negative    33. Pine mix Negative    34. Sycamore Eastern Negative    35. Walnut, Black Pollen Negative    36. Alternaria alternata Negative    37. Cladosporium Herbarum Negative    38. Aspergillus mix Negative    39. Penicillium mix Negative    40. Bipolaris sorokiniana (Helminthosporium) Negative    41. Drechslera spicifera (Curvularia) Negative    42. Mucor plumbeus Negative    43. Fusarium moniliforme Negative    44.  Aureobasidium pullulans (pullulara) Negative    45. Rhizopus oryzae Negative    46. Botrytis cinera Negative    47. Epicoccum nigrum Negative    48. Phoma betae Negative    49. Candida Albicans Negative    50. Trichophyton mentagrophytes Negative    51. Mite, D Farinae  5,000 AU/ml 4+    52. Mite, D Pteronyssinus  5,000 AU/ml 4+    53. Cat Hair 10,000 BAU/ml Negative    54.  Dog Epithelia Negative    55. Mixed Feathers Negative    56. Horse Epithelia Negative    57. Cockroach, German Negative    58. Mouse Negative    59. Tobacco Leaf Negative          Intradermal - 10/25/20 1036    Time Antigen Placed 1036    Allergen Manufacturer Waynette Buttery    Location Arm    Number of Test 13    Control Negative    French Southern Territ[place] Negative    Johnson Negative    Ragweed mix Negative    Weed mix Negative    Tree mix Negative    Mold 1 Negative    Mold 2 Negative     Mold 3 Negative    Mold 4 Negative    Cat Negative    Dog Negative    Cockroach Negative           Past Medical History: Patient Active Problem List   Diagnosis Date Noted  . Moderate persistent asthma 07/10/2018  . Seasonal and perennial allergic rhinoconjunctivitis 07/10/2018  . Haglund's deformity of right heel 06/13/2018  . Achilles tendinitis, right leg 06/13/2018  . Acute midline low back pain with left-sided sciatica 07/16/2017  . Synovial cyst of left knee 01/01/2017  . Chronic pain of left knee 06/19/2016  . Left hamstring injury 06/11/2016   Past Medical History:  Diagnosis Date  . Allergies   . Asthma   . Bronchitis    Past Surgical History: Past Surgical History:  Procedure Laterality Date  . TUBAL LIGATION     Medication List:  Current Outpatient Medications  Medication Sig Dispense Refill  . albuterol (PROAIR HFA) 108 (90 Base) MCG/ACT inhaler Use 1-2 puffs every 4-6 hours as needed 18 g 3  . albuterol (PROVENTIL) (2.5 MG/3ML) 0.083% nebulizer solution Take 3 mLs (2.5 mg total) by nebulization every 6 (six) hours as needed for wheezing or shortness of breath. 75 mL 3  . EPINEPHrine (EPIPEN 2-PAK) 0.3 mg/0.3 mL IJ SOAJ injection EpiPen 2-Pak 0.3 mg/0.3 mL injection, auto-injector 1 each 2  . fluticasone (FLONASE) 50 MCG/ACT nasal spray Place 1 spray into both nostrils 2 (two) times daily as needed for rhinitis. 16 g 5  . fluticasone (FLOVENT HFA) 110 MCG/ACT inhaler Inhale 2 puffs into the lungs in the morning and at bedtime. 12 g 5  . Polyethyl Glycol-Propyl Glycol (LUBRICANT EYE DROPS) 0.4-0.3 % SOLN Apply 1 drop to eye 3 (three) times daily as needed. 10 mL 0  . fluticasone (FLOVENT HFA) 110 MCG/ACT inhaler Inhale 2 puffs into the lungs 2 (two) times daily. (Patient not taking: Reported on 10/25/2020) 1 Inhaler 5  . Olopatadine HCl (PATADAY) 0.2 % SOLN Place 1 drop into both eyes daily as needed. (Patient not taking: Reported on 10/25/2020) 2.5 mL 5  .  rosuvastatin (CRESTOR) 40 MG tablet Take 1 tablet (40 mg total) by mouth daily. 90 tablet 3   No current facility-administered medications for this visit.   Allergies: No Known Allergies  Social History: Social History   Socioeconomic History  . Marital status: Single    Spouse name: Not on file  . Number of children: Not on file  . Years of education: Not on file  . Highest education level: Not on file  Occupational History  . Not on file  Tobacco Use  . Smoking status: Never Smoker  . Smokeless tobacco: Never Used  Vaping Use  . Vaping Use: Never used  Substance and Sexual Activity  . Alcohol use: No  . Drug use: No  . Sexual activity: Yes    Birth control/protection: None, Surgical  Other Topics Concern  . Not on file  Social History Narrative  . Not on file   Social Determinants of Health   Financial Resource Strain: Not on file  Food Insecurity: Not on file  Transportation Needs: Not on file  Physical Activity: Not on file  Stress: Not on file  Social Connections: Not on file   Lives in an apartment. Smoking: denies Occupation: Psychologist, prison and probation services History: Water Damage/mildew in the house: no Carpet in the family room: yes Carpet in the bedroom: yes Heating: gas Cooling: central Pet: used to have 1 dog but removed due to symptoms.   Family History: Family History  Problem Relation Age of Onset  . Allergic rhinitis Mother   . Asthma Son   . Allergic rhinitis Son   . Asthma Daughter   . Allergic rhinitis Daughter   . Bronchitis Daughter   . Asthma Son   . Allergic rhinitis Son   . Eczema Neg Hx   . Urticaria Neg Hx   . Immunodeficiency Neg Hx   . Angioedema Neg Hx    Review of Systems  Constitutional: Negative for appetite change, chills, fever and unexpected weight change.  HENT: Positive for congestion and rhinorrhea.   Eyes: Positive for itching.  Respiratory: Negative for cough, chest tightness, shortness of breath and wheezing.    Cardiovascular: Negative for chest pain.  Gastrointestinal: Negative for abdominal pain.  Genitourinary: Negative for difficulty urinating.  Skin: Negative for rash.  Allergic/Immunologic: Positive for environmental allergies.  Neurological: Positive for headaches.   Objective: BP 116/64 (BP Location: Left Arm, Patient Position: Sitting, Cuff Size: Normal)   Pulse 68   Temp (!) 97.1 F (36.2 C) (Temporal)   Resp 16   Ht 5\' 7"  (1.702 m)   Wt 192 lb (87.1 kg)   SpO2 100%   BMI 30.07 kg/m  Body mass index is 30.07 kg/m. Physical Exam Vitals and nursing note reviewed.  Constitutional:      Appearance: Normal appearance. She is well-developed.  HENT:     Head: Normocephalic and atraumatic.     Right Ear: External ear normal.     Left Ear: External ear normal.     Nose: Nose normal.     Mouth/Throat:     Mouth: Mucous membranes are moist.     Pharynx: Oropharynx is clear.  Eyes:     Conjunctiva/sclera: Conjunctivae normal.  Cardiovascular:     Rate and Rhythm: Normal rate and regular rhythm.     Heart sounds: Normal heart sounds. No murmur heard. No friction rub. No gallop.   Pulmonary:     Effort: Pulmonary effort is normal.     Breath sounds: Normal breath sounds. No wheezing, rhonchi or rales.  Abdominal:     Palpations: Abdomen is soft.  Musculoskeletal:     Cervical back: Neck supple.  Skin:    General:  Skin is warm.     Findings: No rash.  Neurological:     Mental Status: She is alert and oriented to person, place, and time.  Psychiatric:        Behavior: Behavior normal.    The plan was reviewed with the patient/family, and all questions/concerned were addressed.  It was my pleasure to see Marie Kramer today and participate in her care. Please feel free to contact me with any questions or concerns.  Sincerely,  Wyline Mood, DO Allergy & Immunology  [facility] office: [phone] [facility] office: [phone]

## 2020-10-25 NOTE — Assessment & Plan Note (Signed)
Rhinoconjunctivitis symptoms for the last couple years mainly from spring through fall.  Tried Zyrtec and Xyzal with some benefit.  Patient does not recall being skin testing but records showed that 2020 skin testing was positive to grass, mold, ragweed, weed, tree, cat, dog, cockroach and dust mites.  Today's skin testing showed: Positive to grass and dust mites.   Start environmental control measures as below.  May use over the counter antihistamines such as Xyzal (levocetirizine) daily.  May use Flonase (fluticasone) nasal spray 1 spray per nostril twice a day as needed for nasal congestion.   Nasal saline spray (i.e., Simply Saline) or nasal saline lavage (i.e., NeilMed) is recommended as needed and prior to medicated nasal sprays.  May use olopatadine eye drops 0.2% once a day as needed for itchy/watery eyes.  Consider allergy injections for long term control if above medications do not help the symptoms - handout given.

## 2020-11-22 ENCOUNTER — Ambulatory Visit: Payer: Federal, State, Local not specified - PPO | Admitting: Orthopaedic Surgery

## 2020-11-22 ENCOUNTER — Encounter: Payer: Self-pay | Admitting: Orthopaedic Surgery

## 2020-11-22 ENCOUNTER — Ambulatory Visit: Payer: Self-pay

## 2020-11-22 VITALS — Ht 63.0 in | Wt 193.0 lb

## 2020-11-22 DIAGNOSIS — G8929 Other chronic pain: Secondary | ICD-10-CM | POA: Diagnosis not present

## 2020-11-22 DIAGNOSIS — M25562 Pain in left knee: Secondary | ICD-10-CM

## 2020-11-22 NOTE — Progress Notes (Signed)
Office Visit Note   Patient: Marie Kramer           Date of Birth: 1976-02-23           MRN: [id number] Visit Date: 11/22/2020              Requested by: Tally Joe, MD [phone] [address] [place],  [place] [address] PCP: Tally Joe, MD   Assessment & Plan: Visit Diagnoses:  1. Chronic pain of left knee     Plan: Impression is left knee medial meniscus tear possibly degenerative in nature.  We will go ahead and obtain MRI of the left knee to assess for this.  Follow-up with [place] once completed.  Follow-Up Instructions: Return for f/u after MRI.   Orders:  Orders Placed This Encounter  Procedures  . XR KNEE 3 VIEW LEFT   No orders of the defined types were placed in this encounter.     Procedures: No procedures performed   Clinical Data: No additional findings.   Subjective: Chief Complaint  Patient presents with  . Left Knee - Pain    HPI patient is a pleasant 45 year old female who comes in today with recurrent left knee pain.  Initial injury was back in 2019 when she fell while at work.  She was seen in our office soon after where cortisone injection was performed.  She had mild relief following the injection.  Her pain has continued to increase over the past few years.  The pain is all to the medial aspect.  She denies any mechanical symptoms.  Pain is worse with any twisting motion, standing a long time, sitting in a low seat or going from a seated to standing position.  She does not take any pain medication for this.  Review of Systems as detailed in HPI.  All others reviewed and are negative.   Objective: Vital Signs: Ht 5\' 3"  (1.6 m)   Wt 193 lb (87.5 kg)   BMI 34.19 kg/m   Physical Exam well-developed and well-nourished female in no acute distress.  Alert and oriented x3.  Ortho Exam left knee exam shows range of motion from 0 to 120 degrees.  Moderate tenderness to the anteromedial joint line.  Ligaments are stable.  She is  neurovascular intact distally.  Specialty Comments:  No specialty comments available.  Imaging: XR KNEE 3 VIEW LEFT  Result Date: 11/22/2020 Moderate joint space narrowing medial compartment    PMFS History: Patient Active Problem List   Diagnosis Date Noted  . Moderate persistent asthma 07/10/2018  . Seasonal and perennial allergic rhinoconjunctivitis 07/10/2018  . Haglund's deformity of right heel 06/13/2018  . Achilles tendinitis, right leg 06/13/2018  . Acute midline low back pain with left-sided sciatica 07/16/2017  . Synovial cyst of left knee 01/01/2017  . Chronic pain of left knee 06/19/2016  . Left hamstring injury 06/11/2016   Past Medical History:  Diagnosis Date  . Allergies   . Asthma   . Bronchitis     Family History  Problem Relation Age of Onset  . Allergic rhinitis Mother   . Asthma Son   . Allergic rhinitis Son   . Asthma Daughter   . Allergic rhinitis Daughter   . Bronchitis Daughter   . Asthma Son   . Allergic rhinitis Son   . Eczema Neg Hx   . Urticaria Neg Hx   . Immunodeficiency Neg Hx   . Angioedema Neg Hx     Past Surgical  History:  Procedure Laterality Date  . TUBAL LIGATION     Social History   Occupational History  . Not on file  Tobacco Use  . Smoking status: Never Smoker  . Smokeless tobacco: Never Used  Vaping Use  . Vaping Use: Never used  Substance and Sexual Activity  . Alcohol use: No  . Drug use: No  . Sexual activity: Yes    Birth control/protection: None, Surgical

## 2020-12-02 DIAGNOSIS — J45909 Unspecified asthma, uncomplicated: Secondary | ICD-10-CM | POA: Diagnosis not present

## 2020-12-02 DIAGNOSIS — Z79899 Other long term (current) drug therapy: Secondary | ICD-10-CM | POA: Diagnosis not present

## 2020-12-02 DIAGNOSIS — M25511 Pain in right shoulder: Secondary | ICD-10-CM | POA: Diagnosis not present

## 2020-12-02 DIAGNOSIS — X500XXA Overexertion from strenuous movement or load, initial encounter: Secondary | ICD-10-CM | POA: Diagnosis not present

## 2020-12-07 ENCOUNTER — Ambulatory Visit
Admission: RE | Admit: 2020-12-07 | Discharge: 2020-12-07 | Disposition: A | Discharge status: Home / Self Care | Payer: Federal, State, Local not specified - PPO | Source: Ambulatory Visit | Attending: Orthopaedic Surgery | Admitting: Orthopaedic Surgery

## 2020-12-07 ENCOUNTER — Other Ambulatory Visit: Payer: Self-pay

## 2020-12-07 DIAGNOSIS — M7122 Synovial cyst of popliteal space [Baker], left knee: Secondary | ICD-10-CM | POA: Diagnosis not present

## 2020-12-07 DIAGNOSIS — M7989 Other specified soft tissue disorders: Secondary | ICD-10-CM | POA: Diagnosis not present

## 2020-12-07 DIAGNOSIS — M25562 Pain in left knee: Secondary | ICD-10-CM

## 2020-12-07 DIAGNOSIS — M1712 Unilateral primary osteoarthritis, left knee: Secondary | ICD-10-CM | POA: Diagnosis not present

## 2020-12-07 DIAGNOSIS — G8929 Other chronic pain: Secondary | ICD-10-CM

## 2020-12-07 DIAGNOSIS — R600 Localized edema: Secondary | ICD-10-CM | POA: Diagnosis not present

## 2020-12-07 NOTE — Progress Notes (Signed)
Needs appt

## 2020-12-08 ENCOUNTER — Telehealth: Payer: Self-pay

## 2020-12-08 NOTE — Telephone Encounter (Signed)
Patient needs f/u appt with Dr Roda Shutters to review MRI report.  Called patient no answer LMOM.

## 2020-12-08 NOTE — Telephone Encounter (Signed)
Appt made for tomorrow at 8:15

## 2020-12-08 NOTE — Progress Notes (Signed)
Called patient no answer LMOM. She needs a f/u appt with Dr Roda Shutters.

## 2020-12-09 ENCOUNTER — Ambulatory Visit: Payer: Federal, State, Local not specified - PPO | Admitting: Orthopaedic Surgery

## 2020-12-28 ENCOUNTER — Other Ambulatory Visit: Payer: Self-pay

## 2020-12-28 MED ORDER — OLOPATADINE HCL 0.2 % OP SOLN: 1.0000 [drp] | Freq: Every day | OPHTHALMIC | 5 refills | Status: DC | PRN

## 2020-12-28 MED ORDER — EPINEPHRINE 0.3 MG/0.3ML IJ SOAJ: INTRAMUSCULAR | 2 refills | Status: AC

## 2020-12-28 MED ORDER — ALBUTEROL SULFATE HFA 108 (90 BASE) MCG/ACT IN AERS: INHALATION_SPRAY | RESPIRATORY_TRACT | 3 refills | Status: DC

## 2020-12-28 NOTE — Progress Notes (Deleted)
Follow Up Note  RE: Marie Kramer MRN: [id number] DOB: [birth year] Date of Office Visit: 12/29/2020  Referring provider: Tally Joe, MD Primary care provider: Tally Joe, MD  Chief Complaint: No chief complaint on file.  History of Present Illness: I had the pleasure of seeing Marie Kramer for a follow up visit at the [facility] on 12/28/2020. She is a 45 y.o. female, who is being followed for asthma, allergic rhino conjunctivitis. Her previous allergy office visit was on 10/25/2020 with Dr. Selena Kramer. Today is a regular follow up visit.  Moderate persistent asthma Diagnosed with asthma as a child and using albuterol 2-4 times per week with some benefit.  Used to be on Flovent and Singulair but not taking a regular basis. ACT score 15. Today's spirometry was normal with no improvement in FEV1 post bronchodilator treatment.  Clinically feeling improved. Daily controller medication(s): START Flovent 2 puffs twice a day with spacer and rinse mouth afterwards. May use albuterol rescue inhaler 2 puffs every 4 to 6 hours as needed for shortness of breath, chest tightness, coughing, and wheezing. May use albuterol rescue inhaler 2 puffs 5 to 15 minutes prior to strenuous physical activities. Monitor frequency of use.  Repeat spirometry at next visit.   Seasonal and perennial allergic rhinoconjunctivitis Rhinoconjunctivitis symptoms for the last couple years mainly from spring through fall.  Tried Zyrtec and Xyzal with some benefit.  Patient does not recall being skin testing but records showed that 2020 skin testing was positive to grass, mold, ragweed, weed, tree, cat, dog, cockroach and dust mites. Today's skin testing showed: Positive to grass and dust mites. Start environmental control measures as below. May use over the counter antihistamines such as Xyzal (levocetirizine) daily. May use Flonase (fluticasone) nasal spray 1 spray per nostril twice a day as  needed for nasal congestion. Nasal saline spray (i.e., Simply Saline) or nasal saline lavage (i.e., NeilMed) is recommended as needed and prior to medicated nasal sprays. May use olopatadine eye drops 0.2% once a day as needed for itchy/watery eyes. Consider allergy injections for long term control if above medications do not help the symptoms - handout given.    Return in about 2 months (around 12/25/2020).  Assessment and Plan: Marie Kramer is a 45 y.o. female with: No problem-specific Assessment & Plan notes found for this encounter.  No follow-ups on file.  No orders of the defined types were placed in this encounter.  Lab Orders  No laboratory test(s) ordered today    Diagnostics: Spirometry:  Tracings reviewed. Her effort: {Blank single:19197::"Good reproducible efforts.","It was hard to get consistent efforts and there is a question as to whether this reflects a maximal maneuver.","Poor effort, data can not be interpreted."} FVC: ***L FEV1: ***L, ***% predicted FEV1/FVC ratio: ***% Interpretation: {Blank single:19197::"Spirometry consistent with mild obstructive disease","Spirometry consistent with moderate obstructive disease","Spirometry consistent with severe obstructive disease","Spirometry consistent with possible restrictive disease","Spirometry consistent with mixed obstructive and restrictive disease","Spirometry uninterpretable due to technique","Spirometry consistent with normal pattern","No overt abnormalities noted given today's efforts"}.  Please see scanned spirometry results for details.  Skin Testing: {Blank single:19197::"Select foods","Environmental allergy panel","Environmental allergy panel and select foods","Food allergy panel","None","Deferred due to recent antihistamines use"}. Positive test to: ***. Negative test to: ***.  Results discussed with patient/family.   Medication List:  Current Outpatient Medications  Medication Sig Dispense Refill   albuterol  (PROAIR HFA) 108 (90 Base) MCG/ACT inhaler Use 1-2 puffs every 4-6 hours as needed 18 g 3  albuterol (PROVENTIL) (2.5 MG/3ML) 0.083% nebulizer solution Take 3 mLs (2.5 mg total) by nebulization every 6 (six) hours as needed for wheezing or shortness of breath. 75 mL 3   EPINEPHrine (EPIPEN 2-PAK) 0.3 mg/0.3 mL IJ SOAJ injection EpiPen 2-Pak 0.3 mg/0.3 mL injection, auto-injector 1 each 2   fluticasone (FLONASE) 50 MCG/ACT nasal spray Place 1 spray into both nostrils 2 (two) times daily as needed for rhinitis. 16 g 5   fluticasone (FLOVENT HFA) 110 MCG/ACT inhaler Inhale 2 puffs into the lungs 2 (two) times daily. 1 Inhaler 5   fluticasone (FLOVENT HFA) 110 MCG/ACT inhaler Inhale 2 puffs into the lungs in the morning and at bedtime. 12 g 5   Olopatadine HCl (PATADAY) 0.2 % SOLN Place 1 drop into both eyes daily as needed. 2.5 mL 5   Polyethyl Glycol-Propyl Glycol (LUBRICANT EYE DROPS) 0.4-0.3 % SOLN Apply 1 drop to eye 3 (three) times daily as needed. 10 mL 0   rosuvastatin (CRESTOR) 40 MG tablet Take 1 tablet (40 mg total) by mouth daily. 90 tablet 3   No current facility-administered medications for this visit.   Allergies: No Known Allergies I reviewed her past medical history, social history, family history, and environmental history and no significant changes have been reported from her previous visit.  Review of Systems  Constitutional:  Negative for appetite change, chills, fever and unexpected weight change.  HENT:  Positive for congestion and rhinorrhea.   Eyes:  Positive for itching.  Respiratory:  Negative for cough, chest tightness, shortness of breath and wheezing.   Cardiovascular:  Negative for chest pain.  Gastrointestinal:  Negative for abdominal pain.  Genitourinary:  Negative for difficulty urinating.  Skin:  Negative for rash.  Allergic/Immunologic: Positive for environmental allergies.  Neurological:  Positive for headaches.   Objective: There were no vitals taken  for this visit. There is no height or weight on file to calculate BMI. Physical Exam Vitals and nursing note reviewed.  Constitutional:      Appearance: Normal appearance. She is well-developed.  HENT:     Head: Normocephalic and atraumatic.     Right Ear: External ear normal.     Left Ear: External ear normal.     Nose: Nose normal.     Mouth/Throat:     Mouth: Mucous membranes are moist.     Pharynx: Oropharynx is clear.  Eyes:     Conjunctiva/sclera: Conjunctivae normal.  Cardiovascular:     Rate and Rhythm: Normal rate and regular rhythm.     Heart sounds: Normal heart sounds. No murmur heard.   No friction rub. No gallop.  Pulmonary:     Effort: Pulmonary effort is normal.     Breath sounds: Normal breath sounds. No wheezing, rhonchi or rales.  Abdominal:     Palpations: Abdomen is soft.  Musculoskeletal:     Cervical back: Neck supple.  Skin:    General: Skin is warm.     Findings: No rash.  Neurological:     Mental Status: She is alert and oriented to person, place, and time.  Psychiatric:        Behavior: Behavior normal.   Previous notes and tests were reviewed. The plan was reviewed with the patient/family, and all questions/concerned were addressed.  It was my pleasure to see Izabellah today and participate in her care. Please feel free to contact me with any questions or concerns.  Sincerely,  Wyline Mood, DO Allergy & Immunology  [facility] office: [phone] [place] office: [phone]

## 2020-12-29 ENCOUNTER — Ambulatory Visit: Payer: Federal, State, Local not specified - PPO | Admitting: Allergy

## 2020-12-29 DIAGNOSIS — J309 Allergic rhinitis, unspecified: Secondary | ICD-10-CM

## 2021-01-17 ENCOUNTER — Telehealth: Payer: Self-pay | Admitting: Orthopaedic Surgery

## 2021-01-17 NOTE — Telephone Encounter (Signed)
Pt left a message stating she needs to change MRI review appt with Dr. [place] Service was no answer so voicemail was left for pt to call back and change appt date.

## 2021-01-18 ENCOUNTER — Ambulatory Visit: Payer: Federal, State, Local not specified - PPO | Admitting: Orthopaedic Surgery

## 2021-01-20 ENCOUNTER — Telehealth: Payer: Self-pay | Admitting: Orthopaedic Surgery

## 2021-01-20 NOTE — Telephone Encounter (Signed)
Called and left pt a vm to call and reschedule follow up appt with dr. Roda Shutters.

## 2021-03-01 ENCOUNTER — Telehealth: Payer: Self-pay | Admitting: Orthopaedic Surgery

## 2021-03-03 ENCOUNTER — Other Ambulatory Visit: Payer: Self-pay

## 2021-03-03 MED ORDER — ALBUTEROL SULFATE HFA 108 (90 BASE) MCG/ACT IN AERS: INHALATION_SPRAY | RESPIRATORY_TRACT | 3 refills | Status: DC

## 2021-03-10 ENCOUNTER — Telehealth: Payer: Self-pay | Admitting: Orthopaedic Surgery

## 2021-03-15 ENCOUNTER — Other Ambulatory Visit: Payer: Self-pay

## 2021-03-15 MED ORDER — ALBUTEROL SULFATE (2.5 MG/3ML) 0.083% IN NEBU: 2.5000 mg | INHALATION_SOLUTION | Freq: Four times a day (QID) | RESPIRATORY_TRACT | 3 refills | Status: DC | PRN

## 2021-03-23 ENCOUNTER — Other Ambulatory Visit (HOSPITAL_COMMUNITY)
Admission: RE | Admit: 2021-03-23 | Discharge: 2021-03-23 | Disposition: A | Discharge status: Home / Self Care | Payer: Federal, State, Local not specified - PPO | Source: Ambulatory Visit | Attending: Obstetrics & Gynecology | Admitting: Obstetrics & Gynecology

## 2021-03-23 ENCOUNTER — Other Ambulatory Visit: Payer: Self-pay

## 2021-03-23 ENCOUNTER — Encounter: Payer: Self-pay | Admitting: Obstetrics & Gynecology

## 2021-03-23 ENCOUNTER — Ambulatory Visit: Payer: Federal, State, Local not specified - PPO | Admitting: Obstetrics & Gynecology

## 2021-03-23 VITALS — BP 108/74 | HR 55 | Ht 64.0 in | Wt 198.0 lb

## 2021-03-23 DIAGNOSIS — R102 Pelvic and perineal pain: Secondary | ICD-10-CM | POA: Diagnosis not present

## 2021-03-23 MED ORDER — IBUPROFEN 600 MG PO TABS: 600.0000 mg | ORAL_TABLET | Freq: Four times a day (QID) | ORAL | 1 refills | Status: DC | PRN

## 2021-03-23 NOTE — Progress Notes (Signed)
Patient ID: Marie Kramer, female   DOB: 08-Jan-1976, 45 y.o.   MRN: [id number]  Chief Complaint  Patient presents with   Pelvic Pain    HPI Marie Kramer is a 45 y.o. female.  G3P0 Patient's last menstrual period was 02/21/2021. For 2 months she has had perimenstrual pelvic pain especially in the RLQ, vaginal discharge, moderate flow and some clots HPI  Past Medical History:  Diagnosis Date   Allergies    Asthma    Bronchitis     Past Surgical History:  Procedure Laterality Date   TUBAL LIGATION      Family History  Problem Relation Age of Onset   Allergic rhinitis Mother    Asthma Son    Allergic rhinitis Son    Asthma Daughter    Allergic rhinitis Daughter    Bronchitis Daughter    Asthma Son    Allergic rhinitis Son    Eczema Neg Hx    Urticaria Neg Hx    Immunodeficiency Neg Hx    Angioedema Neg Hx     Social History Social History   Tobacco Use   Smoking status: Never   Smokeless tobacco: Never  Vaping Use   Vaping Use: Never used  Substance Use Topics   Alcohol use: No   Drug use: No    No Known Allergies  Current Outpatient Medications  Medication Sig Dispense Refill   albuterol (PROAIR HFA) 108 (90 Base) MCG/ACT inhaler Use 1-2 puffs every 4-6 hours as needed 18 g 3   albuterol (PROVENTIL) (2.5 MG/3ML) 0.083% nebulizer solution Take 3 mLs (2.5 mg total) by nebulization every 6 (six) hours as needed for wheezing or shortness of breath. 75 mL 3   EPINEPHrine (EPIPEN 2-PAK) 0.3 mg/0.3 mL IJ SOAJ injection EpiPen 2-Pak 0.3 mg/0.3 mL injection, auto-injector 1 each 2   fluticasone (FLONASE) 50 MCG/ACT nasal spray Place 1 spray into both nostrils 2 (two) times daily as needed for rhinitis. 16 g 5   fluticasone (FLOVENT HFA) 110 MCG/ACT inhaler Inhale 2 puffs into the lungs 2 (two) times daily. 1 Inhaler 5   fluticasone (FLOVENT HFA) 110 MCG/ACT inhaler Inhale 2 puffs into the lungs in the morning and at bedtime. 12 g 5   Olopatadine HCl (PATADAY)  0.2 % SOLN Place 1 drop into both eyes daily as needed. 2.5 mL 5   Polyethyl Glycol-Propyl Glycol (LUBRICANT EYE DROPS) 0.4-0.3 % SOLN Apply 1 drop to eye 3 (three) times daily as needed. 10 mL 0   rosuvastatin (CRESTOR) 40 MG tablet Take 1 tablet (40 mg total) by mouth daily. 90 tablet 3   No current facility-administered medications for this visit.    Review of Systems Review of Systems  Respiratory: Negative.    Cardiovascular: Negative.   Gastrointestinal: Negative.   Genitourinary:  Positive for menstrual problem (cramps) and pelvic pain (RLQ). Negative for vaginal bleeding, vaginal discharge and vaginal pain.   Blood pressure 108/74, pulse (!) 55, height 5\' 4"  (1.626 m), weight 198 lb (89.8 kg), last menstrual period 02/21/2021.  Physical Exam Physical Exam Vitals and nursing note reviewed. Exam conducted with a chaperone present.  Constitutional:      Appearance: Normal appearance.  HENT:     Head: Normocephalic.  Cardiovascular:     Rate and Rhythm: Normal rate.  Pulmonary:     Effort: Pulmonary effort is normal.  Genitourinary:    General: Normal vulva.     Exam position: Lithotomy position.     Vagina:  Normal.     Cervix: Normal.     Uterus: Normal. Enlarged (6 week size).      Adnexa: Right adnexa normal and left adnexa normal.  Neurological:     Mental Status: She is alert.    Data Reviewed Normal pap 2021  Assessment Female pelvic pain - Plan: Cervicovaginal ancillary only( [place]), Hepatitis B surface antigen, Hepatitis C antibody, HIV Antibody (routine testing w rflx), RPR, US PELVIC COMPLETE WITH TRANSVAGINAL, ibuprofen (ADVIL) 600 MG tablet   Plan F/u after testing Meds ordered this encounter  Medications   ibuprofen (ADVIL) 600 MG tablet    Sig: Take 1 tablet (600 mg total) by mouth every 6 (six) hours as needed.    Dispense:  30 tablet    Refill:  1       Scheryl Darter 03/23/2021, 9:57 AM

## 2021-03-23 NOTE — Progress Notes (Signed)
NEW GYN patient presents for problem visit today.   LMP:02/21/21 Monthly cycle with Moderate flow with cramps and clots.   Contraception:BTL  STD Screening: Desires full panel.  Last pap:02/16/2020 [facility]. No Hx of Abnormal paps.  Family Hx of Breast Cancer: None  Family Hx of Ovarian Cancer: None   CC: pt concerned she may have ovarian cyst. Pt notes onset pain x 1 month ago. Pt notes when start of cycle she gets a itchy discharge.

## 2021-03-24 LAB — CERVICOVAGINAL ANCILLARY ONLY
Bacterial Vaginitis (gardnerella): POSITIVE — AB
Candida Glabrata: NEGATIVE
Candida Vaginitis: NEGATIVE
Chlamydia: NEGATIVE
Comment: NEGATIVE
Comment: NEGATIVE
Comment: NEGATIVE
Comment: NEGATIVE
Comment: NEGATIVE
Comment: NORMAL
Neisseria Gonorrhea: NEGATIVE
Trichomonas: POSITIVE — AB

## 2021-03-24 LAB — HIV ANTIBODY (ROUTINE TESTING W REFLEX): HIV Screen 4th Generation wRfx: NONREACTIVE

## 2021-03-24 LAB — HEPATITIS B SURFACE ANTIGEN: Hepatitis B Surface Ag: NEGATIVE

## 2021-03-24 LAB — HEPATITIS C ANTIBODY: Hep C Virus Ab: <0.1 s/co ratio (ref 0.0–0.9)

## 2021-03-24 LAB — RPR: RPR Ser Ql: NONREACTIVE

## 2021-03-29 MED ORDER — METRONIDAZOLE 500 MG PO TABS: 500.0000 mg | ORAL_TABLET | Freq: Two times a day (BID) | ORAL | 0 refills | Status: DC

## 2021-03-29 NOTE — Addendum Note (Signed)
Addended by: Adam Phenix on: 03/29/2021 12:26 PM   Modules accepted: Orders

## 2021-03-30 ENCOUNTER — Ambulatory Visit (HOSPITAL_COMMUNITY)
Admission: RE | Admit: 2021-03-30 | Discharge: 2021-03-30 | Disposition: A | Discharge status: Home / Self Care | Payer: Federal, State, Local not specified - PPO | Source: Ambulatory Visit | Attending: Obstetrics & Gynecology | Admitting: Obstetrics & Gynecology

## 2021-03-30 ENCOUNTER — Other Ambulatory Visit: Payer: Self-pay

## 2021-03-30 DIAGNOSIS — R102 Pelvic and perineal pain: Secondary | ICD-10-CM | POA: Diagnosis not present

## 2021-03-30 DIAGNOSIS — D251 Intramural leiomyoma of uterus: Secondary | ICD-10-CM | POA: Diagnosis not present

## 2021-03-30 DIAGNOSIS — D250 Submucous leiomyoma of uterus: Secondary | ICD-10-CM | POA: Diagnosis not present

## 2021-04-06 ENCOUNTER — Telehealth: Payer: Self-pay

## 2021-04-06 NOTE — Telephone Encounter (Signed)
-----   Message from Adam Phenix, MD sent at 04/06/2021 11:43 AM EDT ----- F/u appt for [place] in 4 weeks

## 2021-04-06 NOTE — Telephone Encounter (Signed)
An unsuccessful call to patient to discuss test results and follow up care.

## 2021-04-14 ENCOUNTER — Other Ambulatory Visit: Payer: Self-pay

## 2021-04-14 ENCOUNTER — Encounter: Payer: Self-pay | Admitting: Orthopaedic Surgery

## 2021-04-14 ENCOUNTER — Ambulatory Visit: Payer: Federal, State, Local not specified - PPO | Admitting: Orthopaedic Surgery

## 2021-04-14 DIAGNOSIS — G8929 Other chronic pain: Secondary | ICD-10-CM | POA: Diagnosis not present

## 2021-04-14 DIAGNOSIS — M25562 Pain in left knee: Secondary | ICD-10-CM | POA: Diagnosis not present

## 2021-04-14 NOTE — Progress Notes (Signed)
Office Visit Note   Patient: Marie Kramer           Date of Birth: [birth year]           MRN: [id number] Visit Date: 04/14/2021              Requested by: Tally Joe, MD [phone] [place],  [place] [address] PCP: Tally Joe, MD   Assessment & Plan: Visit Diagnoses:  1. Chronic pain of left knee     Plan: Impression is chronic left knee pain with questionable medial plica syndrome.  Given patient's longevity of symptoms and minimal relief from intra-articular cortisone viscosupplementation injections, we discussed continuation of oral and topical anti-inflammatories versus diagnostic arthroscopy and evaluation for medial plica.  She would like to think about this and let us know.  She will follow-up with [place] as needed.  Total face to face encounter time was greater than 25 minutes and over half of this time was spent in counseling and/or coordination of care.  Follow-Up Instructions: Return if symptoms worsen or fail to improve.   Orders:  No orders of the defined types were placed in this encounter.  No orders of the defined types were placed in this encounter.     Procedures: No procedures performed   Clinical Data: No additional findings.   Subjective: Chief Complaint  Patient presents with   Left Knee - Follow-up    MRI review    HPI patient is a pleasant 45 year old female who comes in today to discuss left knee MRI from 4 months ago.  She has had continued pain to the medial aspect which has progressively worsened following a fall while at work back in 2019.  The pain she has is worse with standing for a long period of time.  She has taken anti-inflammatories without relief.  She has previously undergone cortisone and viscosupplementation injections without more than a few weeks relief from each.  MRI of the left knee from June of this year shows chronic ACL mucoid degeneration with intercondylar notch cyst formation and intraosseous cyst  formation of the distal femur proximal tibia.  Mild degenerative changes the medial patellofemoral compartments.  Review of Systems as detailed in HPI.  All others reviewed and are negative.   Objective: Vital Signs: There were no vitals taken for this visit.  Physical Exam well-developed well-nourished female no acute distress.  Alert and oriented x3.  Ortho Exam examination of the left knee shows moderate tenderness along the medial joint line.  Range of motion 0 to 115 degrees.  She is stable valgus varus stress.  She is neurovascular tact distally.  Specialty Comments:  No specialty comments available.  Imaging: No new imaging   PMFS History: Patient Active Problem List   Diagnosis Date Noted   Moderate persistent asthma 07/10/2018   Seasonal and perennial allergic rhinoconjunctivitis 07/10/2018   Haglund's deformity of right heel 06/13/2018   Achilles tendinitis, right leg 06/13/2018   Acute midline low back pain with left-sided sciatica 07/16/2017   Synovial cyst of left knee 01/01/2017   Chronic pain of left knee 06/19/2016   Left hamstring injury 06/11/2016   Past Medical History:  Diagnosis Date   Allergies    Asthma    Bronchitis     Family History  Problem Relation Age of Onset   Allergic rhinitis Mother    Asthma Son    Allergic rhinitis Son    Asthma Daughter    Allergic rhinitis  Daughter    Bronchitis Daughter    Asthma Son    Allergic rhinitis Son    Eczema Neg Hx    Urticaria Neg Hx    Immunodeficiency Neg Hx    Angioedema Neg Hx     Past Surgical History:  Procedure Laterality Date   TUBAL LIGATION     Social History   Occupational History   Not on file  Tobacco Use   Smoking status: Never   Smokeless tobacco: Never  Vaping Use   Vaping Use: Never used  Substance and Sexual Activity   Alcohol use: No   Drug use: No   Sexual activity: Not Currently    Birth control/protection: None, Surgical

## 2021-04-17 DIAGNOSIS — J029 Acute pharyngitis, unspecified: Secondary | ICD-10-CM | POA: Diagnosis not present

## 2021-04-17 DIAGNOSIS — Z20822 Contact with and (suspected) exposure to covid-19: Secondary | ICD-10-CM | POA: Diagnosis not present

## 2021-05-10 ENCOUNTER — Ambulatory Visit: Payer: Federal, State, Local not specified - PPO | Admitting: Obstetrics & Gynecology

## 2021-06-10 ENCOUNTER — Other Ambulatory Visit: Payer: Self-pay | Admitting: Obstetrics & Gynecology

## 2021-06-10 DIAGNOSIS — R102 Pelvic and perineal pain: Secondary | ICD-10-CM

## 2021-09-04 DIAGNOSIS — R509 Fever, unspecified: Secondary | ICD-10-CM | POA: Diagnosis not present

## 2021-09-04 DIAGNOSIS — R051 Acute cough: Secondary | ICD-10-CM | POA: Diagnosis not present

## 2021-09-04 DIAGNOSIS — R0981 Nasal congestion: Secondary | ICD-10-CM | POA: Diagnosis not present

## 2021-09-04 DIAGNOSIS — U071 COVID-19: Secondary | ICD-10-CM | POA: Diagnosis not present

## 2021-09-06 DIAGNOSIS — U071 COVID-19: Secondary | ICD-10-CM | POA: Diagnosis not present

## 2021-10-12 ENCOUNTER — Other Ambulatory Visit: Payer: Self-pay | Admitting: Family Medicine

## 2021-11-13 NOTE — Telephone Encounter (Signed)
err

## 2021-11-21 ENCOUNTER — Telehealth: Payer: Self-pay | Admitting: Orthopaedic Surgery

## 2021-11-21 NOTE — Telephone Encounter (Signed)
Called pt 1X and left vm for pt to set an appt with Dr Erlinda Hong for left leg/knee pains

## 2021-12-15 ENCOUNTER — Encounter: Payer: Federal, State, Local not specified - PPO | Admitting: Obstetrics and Gynecology

## 2022-03-08 ENCOUNTER — Ambulatory Visit: Payer: Federal, State, Local not specified - PPO | Admitting: Orthopaedic Surgery

## 2022-03-23 DIAGNOSIS — U071 COVID-19: Secondary | ICD-10-CM | POA: Diagnosis not present

## 2022-03-23 DIAGNOSIS — J45901 Unspecified asthma with (acute) exacerbation: Secondary | ICD-10-CM | POA: Diagnosis not present

## 2022-03-23 DIAGNOSIS — J4521 Mild intermittent asthma with (acute) exacerbation: Secondary | ICD-10-CM | POA: Diagnosis not present

## 2022-03-23 DIAGNOSIS — R509 Fever, unspecified: Secondary | ICD-10-CM | POA: Diagnosis not present

## 2022-03-23 DIAGNOSIS — R0602 Shortness of breath: Secondary | ICD-10-CM | POA: Diagnosis not present

## 2022-03-23 DIAGNOSIS — R059 Cough, unspecified: Secondary | ICD-10-CM | POA: Diagnosis not present

## 2022-04-24 ENCOUNTER — Ambulatory Visit: Payer: Medicaid Other | Admitting: Family Medicine

## 2022-06-12 ENCOUNTER — Ambulatory Visit: Payer: Medicaid Other | Admitting: Obstetrics & Gynecology

## 2022-06-28 ENCOUNTER — Emergency Department (HOSPITAL_COMMUNITY)
Admission: EM | Admit: 2022-06-28 | Discharge: 2022-06-29 | Discharge status: Against Medical Advice | Payer: Federal, State, Local not specified - PPO | Attending: Emergency Medicine | Admitting: Emergency Medicine

## 2022-06-28 ENCOUNTER — Other Ambulatory Visit: Payer: Self-pay

## 2022-06-28 ENCOUNTER — Encounter (HOSPITAL_COMMUNITY): Payer: Self-pay

## 2022-06-28 DIAGNOSIS — Z5321 Procedure and treatment not carried out due to patient leaving prior to being seen by health care provider: Secondary | ICD-10-CM | POA: Diagnosis not present

## 2022-06-28 DIAGNOSIS — R209 Unspecified disturbances of skin sensation: Secondary | ICD-10-CM | POA: Diagnosis not present

## 2022-06-28 NOTE — ED Triage Notes (Signed)
Patient c/o numbness and tingling from the right axilla to the right hand x 4 days. Patient denies any injury and heavy lifting.

## 2022-07-31 ENCOUNTER — Ambulatory Visit: Payer: Federal, State, Local not specified - PPO | Admitting: Obstetrics

## 2022-08-01 ENCOUNTER — Encounter: Payer: Federal, State, Local not specified - PPO | Admitting: Obstetrics and Gynecology

## 2022-08-30 ENCOUNTER — Ambulatory Visit (INDEPENDENT_AMBULATORY_CARE_PROVIDER_SITE_OTHER): Payer: Federal, State, Local not specified - PPO | Admitting: Obstetrics

## 2022-08-30 ENCOUNTER — Encounter: Payer: Self-pay | Admitting: Obstetrics

## 2022-08-30 ENCOUNTER — Other Ambulatory Visit (HOSPITAL_COMMUNITY)
Admission: RE | Admit: 2022-08-30 | Discharge: 2022-08-30 | Disposition: A | Discharge status: Home / Self Care | Payer: Federal, State, Local not specified - PPO | Source: Ambulatory Visit | Attending: Obstetrics | Admitting: Obstetrics

## 2022-08-30 VITALS — BP 114/73 | HR 58 | Ht 64.0 in | Wt 199.5 lb

## 2022-08-30 DIAGNOSIS — N898 Other specified noninflammatory disorders of vagina: Secondary | ICD-10-CM

## 2022-08-30 DIAGNOSIS — Z113 Encounter for screening for infections with a predominantly sexual mode of transmission: Secondary | ICD-10-CM

## 2022-08-30 DIAGNOSIS — Z01419 Encounter for gynecological examination (general) (routine) without abnormal findings: Secondary | ICD-10-CM

## 2022-08-30 DIAGNOSIS — Z1339 Encounter for screening examination for other mental health and behavioral disorders: Secondary | ICD-10-CM | POA: Diagnosis not present

## 2022-08-30 DIAGNOSIS — E669 Obesity, unspecified: Secondary | ICD-10-CM

## 2022-08-30 DIAGNOSIS — Z1239 Encounter for other screening for malignant neoplasm of breast: Secondary | ICD-10-CM

## 2022-08-30 MED ORDER — METRONIDAZOLE 500 MG PO TABS: 500.0000 mg | ORAL_TABLET | Freq: Two times a day (BID) | ORAL | 2 refills | Status: DC

## 2022-08-30 MED ORDER — FLUCONAZOLE 150 MG PO TABS: 150.0000 mg | ORAL_TABLET | Freq: Once | ORAL | 0 refills | Status: AC

## 2022-08-30 NOTE — Progress Notes (Signed)
Subjective:        Marie Kramer is a 47 y.o. female here for a routine exam.  Current complaints: Malodorous vaginal discharge.    Personal health questionnaire:  Is patient Ashkenazi Jewish, have a family history of breast and/or ovarian cancer: no Is there a family history of uterine cancer diagnosed at age < 50, gastrointestinal cancer, urinary tract cancer, family member who is a Field seismologist syndrome-associated carrier: no Is the patient overweight and hypertensive, family history of diabetes, personal history of gestational diabetes, preeclampsia or PCOS: no Is patient over 61, have PCOS,  family history of premature CHD under age 54, diabetes, smoke, have hypertension or peripheral artery disease:  no At any time, has a partner hit, kicked or otherwise hurt or frightened you?: no Over the past 2 weeks, have you felt down, depressed or hopeless?: no Over the past 2 weeks, have you felt little interest or pleasure in doing things?:no   Gynecologic History Patient's last menstrual period was 08/22/2022. Contraception: tubal ligation Last Pap: 2021. Results were: normal Last mammogram: 2019. Results were: normal  Obstetric History OB History  Gravida Para Term Preterm AB Living  3         3  SAB IAB Ectopic Multiple Live Births               # Outcome Date GA Lbr Len/2nd Weight Sex Delivery Anes PTL Lv  3 Gravida           2 Gravida           1 [place]             Past Medical History:  Diagnosis Date   Allergies    Asthma    Bronchitis     Past Surgical History:  Procedure Laterality Date   TUBAL LIGATION       Current Outpatient Medications:    fluconazole (DIFLUCAN) 150 MG tablet, Take 1 tablet (150 mg total) by mouth once for 1 dose., Disp: 1 tablet, Rfl: 0   fluticasone (FLOVENT HFA) 110 MCG/ACT inhaler, Inhale 2 puffs into the lungs 2 (two) times daily., Disp: 1 Inhaler, Rfl: 5   fluticasone (FLOVENT HFA) 110 MCG/ACT inhaler, Inhale 2 puffs into the lungs  in the morning and at bedtime., Disp: 12 g, Rfl: 5   ibuprofen (ADVIL) 600 MG tablet, Take 1 tablet (600 mg total) by mouth every 6 (six) hours as needed., Disp: 30 tablet, Rfl: 1   metroNIDAZOLE (FLAGYL) 500 MG tablet, Take 1 tablet (500 mg total) by mouth 2 (two) times daily., Disp: 14 tablet, Rfl: 2   Olopatadine HCl (PATADAY) 0.2 % SOLN, Place 1 drop into both eyes daily as needed., Disp: 2.5 mL, Rfl: 5   Polyethyl Glycol-Propyl Glycol (LUBRICANT EYE DROPS) 0.4-0.3 % SOLN, Apply 1 drop to eye 3 (three) times daily as needed., Disp: 10 mL, Rfl: 0   albuterol (PROAIR HFA) 108 (90 Base) MCG/ACT inhaler, Use 1-2 puffs every 4-6 hours as needed (Patient not taking: Reported on 08/30/2022), Disp: 18 g, Rfl: 3   Albuterol Sulfate 2.5 MG/0.5ML NEBU, Please specify directions, refills and quantity (Patient not taking: Reported on 08/30/2022), Disp: 75 mL, Rfl: 1   EPINEPHrine (EPIPEN 2-PAK) 0.3 mg/0.3 mL IJ SOAJ injection, EpiPen 2-Pak 0.3 mg/0.3 mL injection, auto-injector (Patient not taking: Reported on 08/30/2022), Disp: 1 each, Rfl: 2   fluticasone (FLONASE) 50 MCG/ACT nasal spray, Place 1 spray into both nostrils 2 (two) times daily as needed for  rhinitis., Disp: 16 g, Rfl: 5   metroNIDAZOLE (FLAGYL) 500 MG tablet, Take 1 tablet (500 mg total) by mouth 2 (two) times daily. (Patient not taking: Reported on 08/30/2022), Disp: 14 tablet, Rfl: 0   rosuvastatin (CRESTOR) 40 MG tablet, Take 1 tablet (40 mg total) by mouth daily., Disp: 90 tablet, Rfl: 3 No Known Allergies  Social History   Tobacco Use   Smoking status: Never   Smokeless tobacco: Never  Substance Use Topics   Alcohol use: No    Family History  Problem Relation Age of Onset   Allergic rhinitis Mother    Asthma Son    Allergic rhinitis Son    Asthma Daughter    Allergic rhinitis Daughter    Bronchitis Daughter    Asthma Son    Allergic rhinitis Son    Eczema Neg Hx    Urticaria Neg Hx    Immunodeficiency Neg Hx    Angioedema Neg  Hx       Review of Systems  Constitutional: negative for fatigue and weight loss Respiratory: negative for cough and wheezing Cardiovascular: negative for chest pain, fatigue and palpitations Gastrointestinal: negative for abdominal pain and change in bowel habits Musculoskeletal:negative for myalgias Neurological: negative for gait problems and tremors Behavioral/Psych: negative for abusive relationship, depression Endocrine: negative for temperature intolerance    Genitourinary:negative for abnormal menstrual periods, genital lesions, hot flashes, sexual problems and vaginal discharge Integument/breast: negative for breast lump, breast tenderness, nipple discharge and skin lesion(s)    Objective:       BP 114/73   Pulse (!) 58   Ht '5\' 4"'$  (1.626 m)   Wt 199 lb 8 oz (90.5 kg)   LMP 08/22/2022   BMI 34.24 kg/m  General:   alert  Skin:   no rash or abnormalities  Lungs:   clear to auscultation bilaterally  Heart:   regular rate and rhythm, S1, S2 normal, no murmur, click, rub or gallop  Breasts:   normal without suspicious masses, skin or nipple changes or axillary nodes  Abdomen:  normal findings: no organomegaly, soft, non-tender and no hernia  Pelvis:  External genitalia: normal general appearance Urinary system: urethral meatus normal and bladder without fullness, nontender Vaginal: normal without tenderness, induration or masses Cervix: normal appearance Adnexa: normal bimanual exam Uterus: anteverted and non-tender, normal size   Lab Review Urine pregnancy test Labs reviewed yes Radiologic studies reviewed yes  I have spent a total of 20 minutes of face-to-face time, excluding clinical staff time, reviewing notes and preparing to see patient, ordering tests and/or medications, and counseling the patient.   Assessment:    1. Encounter for gynecological examination with Papanicolaou smear of cervix Rx: - Cytology - PAP( [facility]) - CBC - Lipid Profile  2.  Vaginal discharge Rx: - Cervicovaginal ancillary only( [place]) - metroNIDAZOLE (FLAGYL) 500 MG tablet; Take 1 tablet (500 mg total) by mouth 2 (two) times daily.  Dispense: 14 tablet; Refill: 2 - fluconazole (DIFLUCAN) 150 MG tablet; Take 1 tablet (150 mg total) by mouth once for 1 dose.  Dispense: 1 tablet; Refill: 0  3. Screen for STD (sexually transmitted disease) Rx: - HIV Antibody (routine testing w rflx) - RPR - Hepatitis B Surface AntiGEN - Hepatitis C Antibody  4. Screening breast examination Rx: - MM DIGITAL SCREENING BILATERAL; Future  5. Obesity (BMI 30.0-34.9) - weight reduction recommended     Plan:    Education reviewed: calcium supplements, depression evaluation, low fat, low cholesterol  diet, safe sex/STD prevention, self breast exams, and weight bearing exercise. Mammogram ordered. Follow up in: 1 year.   Meds ordered this encounter  Medications   metroNIDAZOLE (FLAGYL) 500 MG tablet    Sig: Take 1 tablet (500 mg total) by mouth 2 (two) times daily.    Dispense:  14 tablet    Refill:  2   fluconazole (DIFLUCAN) 150 MG tablet    Sig: Take 1 tablet (150 mg total) by mouth once for 1 dose.    Dispense:  1 tablet    Refill:  0   Orders Placed This Encounter  Procedures   MM DIGITAL SCREENING BILATERAL    Standing Status:   Future    Standing Expiration Date:   08/30/2023    Order Specific Question:   Reason for exam:    Answer:   Screening    Order Specific Question:   Is the patient pregnant?    Answer:   No    Order Specific Question:   Preferred imaging location?    Answer:   [facility]   HIV Antibody (routine testing w rflx)   RPR   Hepatitis B Surface AntiGEN   Hepatitis C Antibody   CBC   Lipid Profile    Shelly Bombard, MD 08/30/2022 9:48 AM

## 2022-08-30 NOTE — Progress Notes (Signed)
Patient presents for AEX.  Last Pap: 02/16/2020- normal   Last Mammogram: 2019-normal  Menstruation/ Contraception: has normal, monthly cycles. Had tubal ligation 2000  Vaginal/Urinary Symptoms: Denies   STD Screen: Desires vaginal swab, and blood work  Other Concerns: has concerns for low iron, and high cholesterol, desires bloodwork

## 2022-08-31 ENCOUNTER — Other Ambulatory Visit: Payer: Self-pay | Admitting: Obstetrics

## 2022-08-31 DIAGNOSIS — B3731 Acute candidiasis of vulva and vagina: Secondary | ICD-10-CM

## 2022-08-31 DIAGNOSIS — D508 Other iron deficiency anemias: Secondary | ICD-10-CM

## 2022-08-31 DIAGNOSIS — A5901 Trichomonal vulvovaginitis: Secondary | ICD-10-CM

## 2022-08-31 DIAGNOSIS — B9689 Other specified bacterial agents as the cause of diseases classified elsewhere: Secondary | ICD-10-CM

## 2022-08-31 LAB — HEPATITIS B SURFACE ANTIGEN: Hepatitis B Surface Ag: NEGATIVE

## 2022-08-31 LAB — CBC
Hematocrit: 33.4 % — ABNORMAL LOW (ref 34.0–46.6)
Hemoglobin: 10.5 g/dL — ABNORMAL LOW (ref 11.1–15.9)
MCH: 25.6 pg — ABNORMAL LOW (ref 26.6–33.0)
MCHC: 31.4 g/dL — ABNORMAL LOW (ref 31.5–35.7)
MCV: 82 fL (ref 79–97)
Platelets: 155 10*3/uL (ref 150–450)
RBC: 4.1 x10E6/uL (ref 3.77–5.28)
RDW: 13.8 % (ref 11.7–15.4)
WBC: 3.2 10*3/uL — ABNORMAL LOW (ref 3.4–10.8)

## 2022-08-31 LAB — RPR: RPR Ser Ql: NONREACTIVE

## 2022-08-31 LAB — CERVICOVAGINAL ANCILLARY ONLY
Bacterial Vaginitis (gardnerella): POSITIVE — AB
Candida Glabrata: NEGATIVE
Candida Vaginitis: POSITIVE — AB
Chlamydia: NEGATIVE
Comment: NEGATIVE
Comment: NEGATIVE
Comment: NEGATIVE
Comment: NEGATIVE
Comment: NEGATIVE
Comment: NORMAL
Neisseria Gonorrhea: NEGATIVE
Trichomonas: POSITIVE — AB

## 2022-08-31 LAB — LIPID PANEL
Chol/HDL Ratio: 6.5 ratio — ABNORMAL HIGH (ref 0.0–4.4)
Cholesterol, Total: 404 mg/dL — ABNORMAL HIGH (ref 100–199)
HDL: 62 mg/dL (ref >39)
LDL Chol Calc (NIH): 333 mg/dL — ABNORMAL HIGH (ref 0–99)
Triglycerides: 73 mg/dL (ref 0–149)
VLDL Cholesterol Cal: 9 mg/dL (ref 5–40)

## 2022-08-31 LAB — HEPATITIS C ANTIBODY: Hep C Virus Ab: NONREACTIVE

## 2022-08-31 LAB — HIV ANTIBODY (ROUTINE TESTING W REFLEX): HIV Screen 4th Generation wRfx: NONREACTIVE

## 2022-08-31 MED ORDER — ACCRUFER 30 MG PO CAPS: 1.0000 | ORAL_CAPSULE | Freq: Two times a day (BID) | ORAL | 3 refills | Status: AC

## 2022-08-31 MED ORDER — FLUCONAZOLE 150 MG PO TABS: 150.0000 mg | ORAL_TABLET | Freq: Once | ORAL | 0 refills | Status: DC

## 2022-08-31 MED ORDER — METRONIDAZOLE 500 MG PO TABS: 500.0000 mg | ORAL_TABLET | Freq: Two times a day (BID) | ORAL | 2 refills | Status: DC

## 2022-09-07 LAB — CYTOLOGY - PAP
Comment: NEGATIVE
Diagnosis: NEGATIVE
High risk HPV: NEGATIVE

## 2022-09-10 ENCOUNTER — Inpatient Hospital Stay (HOSPITAL_BASED_OUTPATIENT_CLINIC_OR_DEPARTMENT_OTHER)
Admission: RE | Admit: 2022-09-10 | Payer: Federal, State, Local not specified - PPO | Source: Ambulatory Visit | Admitting: Radiology

## 2022-09-13 ENCOUNTER — Other Ambulatory Visit: Payer: Self-pay | Admitting: Obstetrics

## 2022-09-13 DIAGNOSIS — B3731 Acute candidiasis of vulva and vagina: Secondary | ICD-10-CM

## 2022-10-02 ENCOUNTER — Other Ambulatory Visit: Payer: Self-pay | Admitting: Cardiology

## 2022-10-03 MED ORDER — ROSUVASTATIN CALCIUM 40 MG PO TABS: 40.0000 mg | ORAL_TABLET | Freq: Every day | ORAL | 0 refills | Status: DC

## 2022-10-11 ENCOUNTER — Other Ambulatory Visit: Payer: Self-pay

## 2022-10-11 DIAGNOSIS — B379 Candidiasis, unspecified: Secondary | ICD-10-CM

## 2022-10-11 MED ORDER — FLUCONAZOLE 150 MG PO TABS: 150.0000 mg | ORAL_TABLET | Freq: Once | ORAL | 0 refills | Status: AC

## 2022-10-15 ENCOUNTER — Other Ambulatory Visit: Payer: Self-pay

## 2022-10-15 DIAGNOSIS — B379 Candidiasis, unspecified: Secondary | ICD-10-CM

## 2022-10-15 MED ORDER — FLUCONAZOLE 150 MG PO TABS: 150.0000 mg | ORAL_TABLET | Freq: Once | ORAL | 0 refills | Status: AC

## 2022-10-17 ENCOUNTER — Other Ambulatory Visit: Payer: Self-pay | Admitting: *Deleted

## 2022-10-17 MED ORDER — ALBUTEROL SULFATE HFA 108 (90 BASE) MCG/ACT IN AERS: 2.0000 | INHALATION_SPRAY | RESPIRATORY_TRACT | 0 refills | Status: AC | PRN

## 2022-10-23 ENCOUNTER — Other Ambulatory Visit: Payer: Self-pay | Admitting: Obstetrics

## 2022-10-23 DIAGNOSIS — Z1231 Encounter for screening mammogram for malignant neoplasm of breast: Secondary | ICD-10-CM

## 2022-10-29 ENCOUNTER — Ambulatory Visit
Admission: RE | Admit: 2022-10-29 | Discharge: 2022-10-29 | Disposition: A | Discharge status: Home / Self Care | Payer: Federal, State, Local not specified - PPO | Source: Ambulatory Visit

## 2022-10-29 DIAGNOSIS — Z1231 Encounter for screening mammogram for malignant neoplasm of breast: Secondary | ICD-10-CM

## 2022-10-31 ENCOUNTER — Other Ambulatory Visit: Payer: Self-pay | Admitting: Obstetrics

## 2022-10-31 DIAGNOSIS — R928 Other abnormal and inconclusive findings on diagnostic imaging of breast: Secondary | ICD-10-CM

## 2022-11-14 ENCOUNTER — Ambulatory Visit
Admission: RE | Admit: 2022-11-14 | Discharge: 2022-11-14 | Disposition: A | Discharge status: Home / Self Care | Payer: Federal, State, Local not specified - PPO | Source: Ambulatory Visit | Attending: Obstetrics | Admitting: Obstetrics

## 2022-11-14 ENCOUNTER — Other Ambulatory Visit: Payer: Self-pay | Admitting: Obstetrics

## 2022-11-14 DIAGNOSIS — R928 Other abnormal and inconclusive findings on diagnostic imaging of breast: Secondary | ICD-10-CM

## 2022-11-14 DIAGNOSIS — R921 Mammographic calcification found on diagnostic imaging of breast: Secondary | ICD-10-CM

## 2022-11-14 DIAGNOSIS — R92332 Mammographic heterogeneous density, left breast: Secondary | ICD-10-CM | POA: Diagnosis not present

## 2022-11-15 ENCOUNTER — Ambulatory Visit
Admission: RE | Admit: 2022-11-15 | Discharge: 2022-11-15 | Disposition: A | Discharge status: Home / Self Care | Payer: Federal, State, Local not specified - PPO | Source: Ambulatory Visit | Attending: Obstetrics | Admitting: Obstetrics

## 2022-11-15 DIAGNOSIS — R921 Mammographic calcification found on diagnostic imaging of breast: Secondary | ICD-10-CM

## 2022-11-15 DIAGNOSIS — N6032 Fibrosclerosis of left breast: Secondary | ICD-10-CM | POA: Diagnosis not present

## 2022-11-15 DIAGNOSIS — N6012 Diffuse cystic mastopathy of left breast: Secondary | ICD-10-CM | POA: Diagnosis not present

## 2022-11-15 HISTORY — PX: BREAST BIOPSY: SHX20

## 2022-11-30 ENCOUNTER — Other Ambulatory Visit: Payer: Self-pay | Admitting: Cardiology

## 2023-01-01 ENCOUNTER — Ambulatory Visit: Payer: Self-pay | Admitting: General Surgery

## 2023-01-01 DIAGNOSIS — N6489 Other specified disorders of breast: Secondary | ICD-10-CM | POA: Diagnosis not present

## 2023-01-01 MED ORDER — KETOROLAC TROMETHAMINE 15 MG/ML IJ SOLN: 15.0000 mg | Freq: Once | INTRAMUSCULAR | Status: AC

## 2023-01-21 ENCOUNTER — Other Ambulatory Visit: Payer: Self-pay | Admitting: Family Medicine

## 2023-01-26 DIAGNOSIS — M25552 Pain in left hip: Secondary | ICD-10-CM | POA: Diagnosis not present

## 2023-01-26 DIAGNOSIS — M545 Low back pain, unspecified: Secondary | ICD-10-CM | POA: Diagnosis not present

## 2023-02-01 ENCOUNTER — Ambulatory Visit: Payer: Federal, State, Local not specified - PPO | Admitting: Orthopaedic Surgery

## 2023-02-01 ENCOUNTER — Encounter: Payer: Self-pay | Admitting: Orthopaedic Surgery

## 2023-02-01 ENCOUNTER — Other Ambulatory Visit (INDEPENDENT_AMBULATORY_CARE_PROVIDER_SITE_OTHER): Payer: Federal, State, Local not specified - PPO

## 2023-02-01 DIAGNOSIS — G8929 Other chronic pain: Secondary | ICD-10-CM | POA: Diagnosis not present

## 2023-02-01 DIAGNOSIS — M25562 Pain in left knee: Secondary | ICD-10-CM

## 2023-02-01 MED ORDER — DICLOFENAC SODIUM 75 MG PO TBEC
75.0000 mg | DELAYED_RELEASE_TABLET | Freq: Two times a day (BID) | ORAL | 2 refills | Status: DC
Start: 2023-02-01 — End: 2024-01-20

## 2023-02-01 NOTE — Progress Notes (Signed)
Office Visit Note   Patient: Marie Kramer           Date of Birth: May 13, 1976           MRN: [id number] Visit Date: 02/01/2023              Requested by: Tally Joe, MD [phone] [place],  [place] [address] PCP: Tally Joe, MD   Assessment & Plan: Visit Diagnoses:  1. Chronic pain of left knee     Plan: Marie Kramer is a 47 year old female with left knee pain for a week.  Impression is osteoarthritis flare.  Nothing to suggest a structural problem inside the knee.  Not reporting mechanical symptoms.  Treatment options were discussed and she would like to try a 2-week course of diclofenac as well as modified activity.  We briefly discussed other options including cortisone and gel injections.  She will try Voltaren gel as well.  Follow-Up Instructions: No follow-ups on file.   Orders:  Orders Placed This Encounter  Procedures   XR KNEE 3 VIEW LEFT   Meds ordered this encounter  Medications   diclofenac (VOLTAREN) 75 MG EC tablet    Sig: Take 1 tablet (75 mg total) by mouth 2 (two) times daily.    Dispense:  30 tablet    Refill:  2      Procedures: No procedures performed   Clinical Data: No additional findings.   Subjective: Chief Complaint  Patient presents with   Left Knee - Pain    HPI Marie Kramer is coming in today for evaluation of left knee pain for a week.  Denies any injuries.  Reports anterior and lateral constant pain that is worse with prolonged standing.  Denies any mechanical symptoms. Review of Systems  Constitutional: Negative.   HENT: Negative.    Eyes: Negative.   Respiratory: Negative.    Cardiovascular: Negative.   Endocrine: Negative.   Musculoskeletal: Negative.   Neurological: Negative.   Hematological: Negative.   Psychiatric/Behavioral: Negative.    All other systems reviewed and are negative.    Objective: Vital Signs: There were no vitals taken for this visit.  Physical Exam Vitals and nursing note  reviewed.  Constitutional:      Appearance: She is well-developed.  HENT:     Head: Atraumatic.     Nose: Nose normal.  Eyes:     Extraocular Movements: Extraocular movements intact.  Cardiovascular:     Pulses: Normal pulses.  Pulmonary:     Effort: Pulmonary effort is normal.  Abdominal:     Palpations: Abdomen is soft.  Musculoskeletal:     Cervical back: Neck supple.  Skin:    General: Skin is warm.     Capillary Refill: Capillary refill takes less than 2 seconds.  Neurological:     Mental Status: She is alert. Mental status is at baseline.  Psychiatric:        Behavior: Behavior normal.        Thought Content: Thought content normal.        Judgment: Judgment normal.     Ortho Exam Examination left knee shows no joint line tenderness.  No effusion.  Collaterals and cruciates are stable. Specialty Comments:  No specialty comments available.  Imaging: No results found.   PMFS History: Patient Active Problem List   Diagnosis Date Noted   Moderate persistent asthma 07/10/2018   Seasonal and perennial allergic rhinoconjunctivitis 07/10/2018   Haglund's deformity of right heel 06/13/2018  Achilles tendinitis, right leg 06/13/2018   Acute midline low back pain with left-sided sciatica 07/16/2017   Synovial cyst of left knee 01/01/2017   Chronic pain of left knee 06/19/2016   Left hamstring injury 06/11/2016   Past Medical History:  Diagnosis Date   Allergies    Asthma    Bronchitis     Family History  Problem Relation Age of Onset   Allergic rhinitis Mother    Asthma Son    Allergic rhinitis Son    Asthma Daughter    Allergic rhinitis Daughter    Bronchitis Daughter    Asthma Son    Allergic rhinitis Son    Eczema Neg Hx    Urticaria Neg Hx    Immunodeficiency Neg Hx    Angioedema Neg Hx     Past Surgical History:  Procedure Laterality Date   BREAST BIOPSY Left 11/15/2022   MM LT BREAST BX W LOC DEV 1ST LESION IMAGE BX SPEC STEREO GUIDE 11/15/2022  [facility]   TUBAL LIGATION     Social History   Occupational History   Not on file  Tobacco Use   Smoking status: Never   Smokeless tobacco: Never  Vaping Use   Vaping status: Never Used  Substance and Sexual Activity   Alcohol use: No   Drug use: No   Sexual activity: Not Currently    Birth control/protection: None, Surgical

## 2023-02-19 ENCOUNTER — Encounter: Payer: Self-pay | Admitting: Obstetrics

## 2023-06-26 ENCOUNTER — Other Ambulatory Visit: Payer: Self-pay | Admitting: Obstetrics

## 2023-07-14 DIAGNOSIS — S92514A Nondisplaced fracture of proximal phalanx of right lesser toe(s), initial encounter for closed fracture: Secondary | ICD-10-CM | POA: Diagnosis not present

## 2023-07-14 DIAGNOSIS — M79674 Pain in right toe(s): Secondary | ICD-10-CM | POA: Diagnosis not present

## 2023-08-12 ENCOUNTER — Encounter: Payer: Self-pay | Admitting: Obstetrics

## 2023-08-12 ENCOUNTER — Ambulatory Visit (INDEPENDENT_AMBULATORY_CARE_PROVIDER_SITE_OTHER): Payer: Federal, State, Local not specified - PPO | Admitting: Obstetrics

## 2023-08-12 VITALS — BP 124/75 | HR 64 | Ht 65.0 in | Wt 201.9 lb

## 2023-08-12 DIAGNOSIS — D508 Other iron deficiency anemias: Secondary | ICD-10-CM

## 2023-08-12 DIAGNOSIS — E66811 Obesity, class 1: Secondary | ICD-10-CM

## 2023-08-12 DIAGNOSIS — Z0000 Encounter for general adult medical examination without abnormal findings: Secondary | ICD-10-CM

## 2023-08-12 NOTE — Progress Notes (Signed)
 Pt presents for annual, but annual/pap is not due yet Last PAP 08/2022 Mammogram 10/2022 Requesting lab work

## 2023-08-12 NOTE — Progress Notes (Signed)
 Patient presents for labs only.  Marie Joiner, MD, FACOG Attending Obstetrician & Gynecologist, [facility], [facility], [place] 08/12/2023

## 2023-08-13 LAB — FERRITIN: Ferritin: 14 ng/mL — ABNORMAL LOW (ref 15–150)

## 2023-08-13 LAB — CBC
Hematocrit: 35.1 % (ref 34.0–46.6)
Hemoglobin: 11.2 g/dL (ref 11.1–15.9)
MCH: 26.3 pg — ABNORMAL LOW (ref 26.6–33.0)
MCHC: 31.9 g/dL (ref 31.5–35.7)
MCV: 82 fL (ref 79–97)
Platelets: 175 10*3/uL (ref 150–450)
RBC: 4.26 x10E6/uL (ref 3.77–5.28)
RDW: 14.3 % (ref 11.7–15.4)
WBC: 3.9 10*3/uL (ref 3.4–10.8)

## 2023-08-13 LAB — COMPREHENSIVE METABOLIC PANEL
ALT: 11 [IU]/L (ref 0–32)
AST: 16 [IU]/L (ref 0–40)
Albumin: 4 g/dL (ref 3.9–4.9)
Alkaline Phosphatase: 44 [IU]/L (ref 44–121)
BUN/Creatinine Ratio: 10 (ref 9–23)
BUN: 9 mg/dL (ref 6–24)
Bilirubin Total: 0.3 mg/dL (ref 0.0–1.2)
CO2: 21 mmol/L (ref 20–29)
Calcium: 9.4 mg/dL (ref 8.7–10.2)
Chloride: 104 mmol/L (ref 96–106)
Creatinine, Ser: 0.9 mg/dL (ref 0.57–1.00)
Globulin, Total: 2.9 g/dL (ref 1.5–4.5)
Glucose: 85 mg/dL (ref 70–99)
Potassium: 4.4 mmol/L (ref 3.5–5.2)
Sodium: 139 mmol/L (ref 134–144)
Total Protein: 6.9 g/dL (ref 6.0–8.5)
eGFR: 78 mL/min/{1.73_m2} (ref >59)

## 2023-08-13 LAB — TRIGLYCERIDES: Triglycerides: 97 mg/dL (ref 0–149)

## 2023-08-13 LAB — TSH: TSH: 2.06 u[IU]/mL (ref 0.450–4.500)

## 2023-08-13 LAB — HDL CHOLESTEROL: HDL: 60 mg/dL (ref >39)

## 2023-08-13 LAB — LDL CHOLESTEROL, DIRECT: LDL Direct: 328 mg/dL — ABNORMAL HIGH (ref 0–99)

## 2023-08-13 LAB — HEMOGLOBIN A1C
Est. average glucose Bld gHb Est-mCnc: 123 mg/dL
Hgb A1c MFr Bld: 5.9 % — ABNORMAL HIGH (ref 4.8–5.6)

## 2023-08-13 LAB — CHOLESTEROL, TOTAL: Cholesterol, Total: 402 mg/dL — ABNORMAL HIGH (ref 100–199)

## 2023-08-14 ENCOUNTER — Other Ambulatory Visit: Payer: Self-pay | Admitting: Obstetrics

## 2023-08-14 DIAGNOSIS — Z0000 Encounter for general adult medical examination without abnormal findings: Secondary | ICD-10-CM

## 2023-09-02 ENCOUNTER — Encounter: Payer: Self-pay | Admitting: Family Medicine

## 2023-09-02 ENCOUNTER — Ambulatory Visit: Payer: Federal, State, Local not specified - PPO | Admitting: Family Medicine

## 2023-09-02 VITALS — BP 115/57 | HR 76 | Ht 65.0 in | Wt 201.0 lb

## 2023-09-02 DIAGNOSIS — D509 Iron deficiency anemia, unspecified: Secondary | ICD-10-CM | POA: Insufficient documentation

## 2023-09-02 DIAGNOSIS — J4540 Moderate persistent asthma, uncomplicated: Secondary | ICD-10-CM

## 2023-09-02 DIAGNOSIS — R7303 Prediabetes: Secondary | ICD-10-CM | POA: Diagnosis not present

## 2023-09-02 DIAGNOSIS — Z0000 Encounter for general adult medical examination without abnormal findings: Secondary | ICD-10-CM

## 2023-09-02 DIAGNOSIS — E7800 Pure hypercholesterolemia, unspecified: Secondary | ICD-10-CM | POA: Diagnosis not present

## 2023-09-02 MED ORDER — ROSUVASTATIN CALCIUM 40 MG PO TABS
40.0000 mg | ORAL_TABLET | Freq: Every day | ORAL | 3 refills | Status: AC
Start: 2023-09-02 — End: ?

## 2023-09-02 NOTE — Assessment & Plan Note (Signed)
 LDL persistently elevated despite max dose Crestor 40mg  daily. Patient is compliant with medication and takes it in the afternoon.  -Change Crestor administration to evening/bedtime. -Order fasting lipid panel and lipoprotein-a  -Provide patient with printed information about high cholesterol prevention.

## 2023-09-02 NOTE — Assessment & Plan Note (Signed)
 Ferritin slightly low, but patient is currently taking iron supplements and hemoglobin is within normal limits. -Order more comprehensive iron panel during next lab visit to assess iron status in more detail.

## 2023-09-02 NOTE — Assessment & Plan Note (Signed)
 Patient reports increased use of albuterol inhaler, possibly due to seasonal allergies. Currently on Flovent twice daily. -Continue current asthma management plan. -Monitor symptoms and adjust treatment as necessary.

## 2023-09-02 NOTE — Progress Notes (Signed)
 New Patient Office Visit  Subjective    Patient ID: Marie Kramer, female    DOB: 1975/09/26  Age: 48 y.o. MRN: [id number]  CC:  Chief Complaint  Patient presents with   Establish Care    HPI Marie Kramer presents to establish care. She lives with her adult daughter and works at the [employer].    Discussed the use of AI scribe software for clinical note transcription with the patient, who gave verbal consent to proceed.  History of Present Illness Marie Kramer "Marie Kramer" is a 48 year old female who presents for follow-up of her cholesterol levels checked by OBGYN recently.  Her LDL cholesterol was over 300 mg/dL during her last blood work, which was not a fasting test. She has been on Crestor 40 mg daily for a while and takes it in the afternoon, with no side effects reported.  She has a history of iron deficiency anemia and is currently taking iron supplements once a day. Recent blood work showed a low ferritin level, but her hemoglobin was stable.  She has a history of asthma and uses albuterol as needed, which has increased to about every three days due to worsening symptoms, likely related to the weather. She also uses Flovent twice a day. She has a history of seasonal and environmental allergies.  Her family history includes asthma and allergies in her children, and allergies in her mother. No family history of cancer, heart disease, or strokes.  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 No alcohol, drug, or nicotine use. She is sexually active and continues to have regular menstrual periods.       Outpatient Encounter Medications as of 09/02/2023  Medication Sig   albuterol (PROAIR HFA) 108 (90 Base) MCG/ACT inhaler Inhale 2 puffs into the lungs every 4 (four) hours as needed for wheezing or shortness of breath.   Albuterol Sulfate 2.5 MG/0.5ML NEBU Please specify directions, refills and quantity    diclofenac (VOLTAREN) 75 MG EC tablet Take 1 tablet (75 mg total) by mouth 2 (two) times daily.   Ferric Maltol (ACCRUFER) 30 MG CAPS Take 1 capsule (30 mg total) by mouth 2 (two) times daily before a meal. Take 2 hrs before, or 2 hrs after a meal.   fluticasone (FLOVENT HFA) 110 MCG/ACT inhaler Inhale 2 puffs into the lungs in the morning and at bedtime.   ibuprofen (ADVIL) 600 MG tablet Take 1 tablet (600 mg total) by mouth every 6 (six) hours as needed.   [DISCONTINUED] fluticasone (FLOVENT HFA) 110 MCG/ACT inhaler Inhale 2 puffs into the lungs 2 (two) times daily.   [DISCONTINUED] rosuvastatin (CRESTOR) 40 MG tablet TAKE 1 TABLET BY MOUTH EVERY DAY   EPINEPHrine (EPIPEN 2-PAK) 0.3 mg/0.3 mL IJ SOAJ injection EpiPen 2-Pak 0.3 mg/0.3 mL injection, auto-injector (Patient not taking: Reported on 09/02/2023)   rosuvastatin (CRESTOR) 40 MG tablet Take 1 tablet (40 mg total) by mouth daily.   [DISCONTINUED] fluticasone (FLONASE) 50 MCG/ACT nasal spray Place 1 spray into both nostrils 2 (two) times daily as needed for rhinitis. (Patient not taking: Reported on 08/12/2023)   [DISCONTINUED] metroNIDAZOLE (FLAGYL) 500 MG tablet Take 1 tablet (500 mg total) by mouth 2 (two) times daily. (Patient not taking: Reported on 08/12/2023)   [DISCONTINUED] metroNIDAZOLE (FLAGYL) 500 MG tablet Take 1 tablet (500 mg total) by mouth 2 (two) times daily. (Patient not taking: Reported on 08/12/2023)   [DISCONTINUED] metroNIDAZOLE (  FLAGYL) 500 MG tablet Take 1 tablet (500 mg total) by mouth 2 (two) times daily. (Patient not taking: Reported on 08/12/2023)   [DISCONTINUED] metroNIDAZOLE (FLAGYL) 500 MG tablet TAKE 1 TABLET BY MOUTH TWICE A DAY (Patient not taking: Reported on 08/12/2023)   [DISCONTINUED] Olopatadine HCl (PATADAY) 0.2 % SOLN Place 1 drop into both eyes daily as needed. (Patient not taking: Reported on 08/12/2023)   [DISCONTINUED] Polyethyl Glycol-Propyl Glycol (LUBRICANT EYE DROPS) 0.4-0.3 % SOLN Apply 1 drop to eye 3  (three) times daily as needed. (Patient not taking: Reported on 08/12/2023)   No facility-administered encounter medications on file as of 09/02/2023.    Past Medical History:  Diagnosis Date   Allergies    Anemia 04/21/1992   Asthma    Bronchitis    Chronic pain of left knee 06/19/2016   Haglund's deformity of right heel 06/13/2018    Past Surgical History:  Procedure Laterality Date   BREAST BIOPSY Left 11/15/2022   MM LT BREAST BX W LOC DEV 1ST LESION IMAGE BX SPEC STEREO GUIDE 11/15/2022 [facility]MO[facility]   TUBAL LIGATION      Family History  Problem Relation Age of Onset   Allergic rhinitis Mother    Asthma Son    Allergic rhinitis Son    Asthma Daughter    Allergic rhinitis Daughter    Bronchitis Daughter    Asthma Son    Allergic rhinitis Son    Eczema Neg Hx    Urticaria Neg Hx    Immunodeficiency Neg Hx    Angioedema Neg Hx     Social History   Socioeconomic History   Marital status: Single    Spouse name: Not on file   Number of children: Not on file   Years of education: Not on file   Highest education level: 12th grade  Occupational History   Not on file  Tobacco Use   Smoking status: Never   Smokeless tobacco: Never  Vaping Use   Vaping status: Never Used  Substance and Sexual Activity   Alcohol use: No   Drug use: No   Sexual activity: Yes    Birth control/protection: Condom, Surgical, None  Other Topics Concern   Not on file  Social History Narrative   Not on file   Social Drivers of Health   Financial Resource Strain: Low Risk  (09/01/2023)   Overall Financial Resource Strain (CARDIA)    Difficulty of Paying Living Expenses: Not hard at all  Food Insecurity: No Food Insecurity (09/01/2023)   Hunger Vital Sign    Worried About Running Out of Food in the Last Year: Never true    Ran Out of Food in the Last Year: Never true  Transportation Needs: No Transportation Needs (09/01/2023)   PRAPARE - Administrator, Civil Service  (Medical): No    Lack of Transportation (Non-Medical): No  Physical Activity: Insufficiently Active (09/01/2023)   Exercise Vital Sign    Days of Exercise per Week: 6 days    Minutes of Exercise per Session: 20 min  Stress: No Stress Concern Present (09/01/2023)   [employer] of Occupational Health - Occupational Stress Questionnaire    Feeling of Stress : Not at all  Social Connections: Moderately Isolated (09/01/2023)   Social Connection and Isolation Panel [NHANES]    Frequency of Communication with Friends and Family: More than three times a week    Frequency of Social Gatherings with Friends and Family: More than three times a week  Attends Religious Services: More than 4 times per year    Active Member of Clubs or Organizations: No    Attends Banker Meetings: Not on file    Marital Status: Divorced  Intimate Partner Violence: Unknown (10/02/2021)   Received from [employer], [facility]   HITS    Physically Hurt: Not on file    Insult or Talk Down To: Not on file    Threaten Physical Harm: Not on file    Scream or Curse: Not on file    ROS All review of systems negative except what is listed in the HPI      Objective    BP (!) 115/57   Pulse 76   Ht 5\' 5"  (1.651 m)   Wt 201 lb (91.2 kg)   BMI 33.45 kg/m   LMP 08/16/23  Physical Exam Vitals reviewed.  Constitutional:      General: She is not in acute distress.    Appearance: Normal appearance. She is obese. She is not ill-appearing.  Neck:     Vascular: No carotid bruit.  Cardiovascular:     Rate and Rhythm: Normal rate and regular rhythm.  Pulmonary:     Effort: Pulmonary effort is normal.     Breath sounds: Normal breath sounds.  Musculoskeletal:     Right lower leg: No edema.     Left lower leg: No edema.  Skin:    General: Skin is warm and dry.  Neurological:     Mental Status: She is alert and oriented to person, place, and time.  Psychiatric:        Mood and Affect: Mood  normal.        Behavior: Behavior normal.        Thought Content: Thought content normal.        Judgment: Judgment normal.            Assessment & Plan:   Problem List Items Addressed This Visit       Active Problems   Moderate persistent asthma (Chronic)   Patient reports increased use of albuterol inhaler, possibly due to seasonal allergies. Currently on Flovent twice daily. -Continue current asthma management plan. -Monitor symptoms and adjust treatment as necessary.      Pure hypercholesterolemia - Primary (Chronic)   LDL persistently elevated despite max dose Crestor 40mg  daily. Patient is compliant with medication and takes it in the afternoon.  -Change Crestor administration to evening/bedtime. -Order fasting lipid panel and lipoprotein-a  -Provide patient with printed information about high cholesterol prevention.      Relevant Medications   rosuvastatin (CRESTOR) 40 MG tablet   Other Relevant Orders   Lipid panel   Lipoprotein A (LPA)   Iron deficiency anemia (Chronic)   Ferritin slightly low, but patient is currently taking iron supplements and hemoglobin is within normal limits. -Order more comprehensive iron panel during next lab visit to assess iron status in more detail.      Relevant Orders   IBC + Ferritin   Prediabetes   Recent A1C 5.9%, indicating prediabetes. Discussed importance of diet, exercise, and hydration. -Repeat A1C in 6 months to monitor for progression.      Other Visit Diagnoses       Encounter for medical examination to establish care           Return for fasting labs this week or next; routine follow-up in 3 months .   Clayborne Dana, NP

## 2023-09-02 NOTE — Patient Instructions (Signed)
 Lifestyle factors for lowering cholesterol include: Diet therapy - heart-healthy diet rich in fruits, veggies, fiber-rich whole grains, lean meats, chicken, fish (at least twice a week), fat-free or 1% dairy products; foods low in saturated/trans fats, cholesterol, sodium, and sugar. Mediterranean diet has shown to be very heart healthy. Regular exercise - recommend at least 30 minutes a day, 5 times per week Weight management    -----------------------------------------------------------   Thank you for choosing [facility] at [facility] for your Primary Care needs. I am excited for the opportunity to partner with you to meet your health care goals. It was a pleasure meeting you today!  Information on diet, exercise, and health maintenance recommendations are listed below. This is information to help you be sure you are on track for optimal health and monitoring.   Please look over this and let us know if you have any questions or if you have completed any of the health maintenance outside of [facility] so that we can be sure your records are up to date.  ___________________________________________________________  MyChart:  For all urgent or time sensitive needs we ask that you please call the office to avoid delays. Our number is [phone]. MyChart is not constantly monitored and due to the large volume of messages a day, replies may take up to 72 business hours.  MyChart Policy: MyChart allows for you to see your visit notes, after visit summary, provider recommendations, lab and tests results, make an appointment, request refills, and contact your provider or the office for non-urgent questions or concerns. Providers are seeing patients during normal business hours and do not have built in time to review MyChart messages.  We ask that you allow a minimum of 3 business days for responses to [employer]. For this reason, please do not send urgent requests  through MyChart. Please call the office at [phone]. New and ongoing conditions may require a visit. We have virtual and in-person visits available for your convenience.  Complex MyChart concerns may require a visit. Your provider may request you schedule a virtual or in-person visit to ensure we are providing the best care possible. MyChart messages sent after 11:00 AM on Friday may not be received by the provider until Monday morning.    Lab and Test Results: You will receive your lab and test results on MyChart as soon as they are completed and results have been sent by the lab or testing facility. Due to this service, you will receive your results BEFORE your provider.  I review lab and test results each morning prior to seeing patients. Some results require collaboration with other providers to ensure you are receiving the most appropriate care. For this reason, we ask that you please allow a minimum of 3-5 business days from the time that ALL results have been received for your provider to receive and review lab and test results and contact you about these.  Most lab and test result comments from the provider will be sent through MyChart. Your provider may recommend changes to the plan of care, follow-up visits, repeat testing, ask questions, or request an office visit to discuss these results. You may reply directly to this message or call the office to provide information for the provider or set up an appointment. In some instances, you will be called with test results and recommendations. Please let us know if this is preferred and we will make note of this in your chart to provide this for you.  If you have not heard a response to your lab or test results in 5 business days from all results returning to MyChart, please call the office to let us know. We ask that you please avoid calling prior to this time unless there is an emergent concern. Due to high call volumes, this can delay the  resulting process.  After Hours: For all non-emergency after hours needs, please call the office at [phone] and select the option to reach the on-call  service. On-call services are shared between multiple [place] offices and therefore it will not be possible to speak directly with your provider. On-call providers may provide medical advice and recommendations, but are unable to provide refills for maintenance medications.  For all emergency or urgent medical needs after normal business hours, we recommend that you seek care at the closest Urgent Care or Emergency Department to ensure appropriate treatment in a timely manner.  [facility] has a 24 hour emergency room located on the ground floor for your convenience.   Urgent Concerns During the Business Day Providers are seeing patients from 8AM to 5PM with a busy schedule and are most often not able to respond to non-urgent calls until the end of the day or the next business day. If you should have URGENT concerns during the day, please call and speak to the nurse or schedule a same day appointment so that we can address your concern without delay.   Thank you, again, for choosing me as your health care partner. I appreciate your trust and look forward to learning more about you!   Lollie Marrow Reola Calkins, DNP, FNP-C  ___________________________________________________________  Health Maintenance Recommendations Screening Testing Mammogram Every 1-2 years based on history and risk factors Starting at age 42 Pap Smear Ages 21-39 every 3 years Ages 102-65 every 5 years with HPV testing More frequent testing may be required based on results and history Colon Cancer Screening Every 1-10 years based on test performed, risk factors, and history Starting at age 31 Bone Density Screening Every 2-10 years based on history Starting at age 53 for women Recommendations for men differ based on medication usage, history, and risk  factors AAA Screening One time ultrasound Men 63-74 years old who have ever smoked Lung Cancer Screening Low Dose Lung CT every 12 months Age 52-80 years with a 20 pack-year smoking history who still smoke or who have quit within the last 15 years  Screening Labs Routine  Labs: Complete Blood Count (CBC), Complete Metabolic Panel (CMP), Cholesterol (Lipid Panel) Every 6-12 months based on history and medications May be recommended more frequently based on current conditions or previous results Hemoglobin A1c Lab Every 3-12 months based on history and previous results Starting at age 13 or earlier with diagnosis of diabetes, high cholesterol, BMI >26, and/or risk factors Frequent monitoring for patients with diabetes to ensure blood sugar control Thyroid Panel  Every 6 months based on history, symptoms, and risk factors May be repeated more often if on medication HIV One time testing for all patients 27 and older May be repeated more frequently for patients with increased risk factors or exposure Hepatitis C One time testing for all patients 37 and older May be repeated more frequently for patients with increased risk factors or exposure Gonorrhea, Chlamydia Every 12 months for all sexually active persons 13-24 years Additional monitoring may be recommended for those who are considered high risk or who have symptoms PSA Men 60-25 years old with risk factors  Additional screening may be recommended from age 70-69 based on risk factors, symptoms, and history  Vaccine Recommendations Tetanus Booster All adults every 10 years Flu Vaccine All patients 6 months and older every year COVID Vaccine All patients 12 years and older Initial dosing with booster May recommend additional booster based on age and health history HPV Vaccine 2 doses all patients age 59-26 Dosing may be considered for patients over 26 Shingles Vaccine (Shingrix) 2 doses all adults 50 years and older Pneumonia  (Pneumovax 23) All adults 65 years and older May recommend earlier dosing based on health history Pneumonia (Prevnar 34) All adults 65 years and older Dosed 1 year after Pneumovax 23 Pneumonia (Prevnar 20) All adults 65 years and older (adults 19-64 with certain conditions or risk factors) 1 dose  For those who have not received Prevnar 13 vaccine previously   Additional Screening, Testing, and Vaccinations may be recommended on an individualized basis based on family history, health history, risk factors, and/or exposure.  __________________________________________________________  Diet Recommendations for All Patients  I recommend that all patients maintain a diet low in saturated fats, carbohydrates, and cholesterol. While this can be challenging at first, it is not impossible and small changes can make big differences.  Things to try: Decreasing the amount of soda, sweet tea, and/or juice to one or less per day and replace with water While water is always the first choice, if you do not like water you may consider adding a water additive without sugar to improve the taste other sugar free drinks Replace potatoes with a brightly colored vegetable  Use healthy oils, such as canola oil or olive oil, instead of butter or hard margarine Limit your bread intake to two pieces or less a day Replace regular pasta with low carb pasta options Bake, broil, or grill foods instead of frying Monitor portion sizes  Eat smaller, more frequent meals throughout the day instead of large meals  An important thing to remember is, if you love foods that are not great for your health, you don't have to give them up completely. Instead, allow these foods to be a reward when you have done well. Allowing yourself to still have special treats every once in a while is a nice way to tell yourself thank you for working hard to keep yourself healthy.   Also remember that every day is a new day. If you have a  bad day and "fall off the wagon", you can still climb right back up and keep moving along on your journey!  We have resources available to help you!  Some websites that may be helpful include: www.http://www.wall-moore.info/  Www.VeryWellFit.com _____________________________________________________________  Activity Recommendations for All Patients  I recommend that all adults get at least 30 minutes of moderate physical activity that elevates your heart rate at least 5 days out of the week.  Some examples include: Walking or jogging at a pace that allows you to carry on a conversation Cycling (stationary bike or outdoors) Water aerobics Yoga Weight lifting Dancing If physical limitations prevent you from putting stress on your joints, exercise in a pool or seated in a chair are excellent options.  Do determine your MAXIMUM heart rate for activity: 220 - YOUR AGE = MAX Heart Rate   Remember! Do not push yourself too hard.  Start slowly and build up your pace, speed, weight, time in exercise, etc.  Allow your body to rest between exercise and get good sleep. You will need more water  than normal when you are exerting yourself. Do not wait until you are thirsty to drink. Drink with a purpose of getting in at least 8, 8 ounce glasses of water a day plus more depending on how much you exercise and sweat.    If you begin to develop dizziness, chest pain, abdominal pain, jaw pain, shortness of breath, headache, vision changes, lightheadedness, or other concerning symptoms, stop the activity and allow your body to rest. If your symptoms are severe, seek emergency evaluation immediately. If your symptoms are concerning, but not severe, please let us know so that we can recommend further evaluation.

## 2023-09-02 NOTE — Assessment & Plan Note (Signed)
 Recent A1C 5.9%, indicating prediabetes. Discussed importance of diet, exercise, and hydration. -Repeat A1C in 6 months to monitor for progression.

## 2023-09-04 ENCOUNTER — Other Ambulatory Visit (INDEPENDENT_AMBULATORY_CARE_PROVIDER_SITE_OTHER)

## 2023-09-04 DIAGNOSIS — E7800 Pure hypercholesterolemia, unspecified: Secondary | ICD-10-CM | POA: Diagnosis not present

## 2023-09-04 DIAGNOSIS — D509 Iron deficiency anemia, unspecified: Secondary | ICD-10-CM | POA: Diagnosis not present

## 2023-09-04 LAB — LIPID PANEL
Cholesterol: 391 mg/dL — ABNORMAL HIGH (ref 0–200)
HDL: 56.2 mg/dL (ref >39.00)
LDL Cholesterol: 314 mg/dL — ABNORMAL HIGH (ref 0–99)
NonHDL: 334.76
Total CHOL/HDL Ratio: 7
Triglycerides: 106 mg/dL (ref 0.0–149.0)
VLDL: 21.2 mg/dL (ref 0.0–40.0)

## 2023-09-04 LAB — IBC + FERRITIN
Ferritin: 7.5 ng/mL — ABNORMAL LOW (ref 10.0–291.0)
Iron: 67 ug/dL (ref 42–145)
Saturation Ratios: 20.2 % (ref 20.0–50.0)
TIBC: 331.8 ug/dL (ref 250.0–450.0)
Transferrin: 237 mg/dL (ref 212.0–360.0)

## 2023-09-06 ENCOUNTER — Encounter: Payer: Self-pay | Admitting: Family Medicine

## 2023-09-07 LAB — LIPOPROTEIN A (LPA): Lipoprotein (a): 436 nmol/L — ABNORMAL HIGH (ref <75)

## 2023-09-09 NOTE — Progress Notes (Signed)
 Lipoprotein-a is elevated, meaning you are at higher risk of high cholesterol and cardiovascular events. Therefore we need to work hard to get your cholesterol under control and lower modifiable risk factors.    **Are you taking the Crestor and how long have you been on it??   Lifestyle factors for lowering cholesterol include: Diet therapy - heart-healthy diet rich in fruits, veggies, fiber-rich whole grains, lean meats, chicken, fish (at least twice a week), fat-free or 1% dairy products; foods low in saturated/trans fats, cholesterol, sodium, and sugar. Mediterranean diet has shown to be very heart healthy. Regular exercise - recommend at least 30 minutes a day, 5 times per week Weight management

## 2023-09-10 ENCOUNTER — Other Ambulatory Visit: Payer: Self-pay | Admitting: Neurology

## 2023-09-10 DIAGNOSIS — E7800 Pure hypercholesterolemia, unspecified: Secondary | ICD-10-CM

## 2023-09-10 DIAGNOSIS — R7303 Prediabetes: Secondary | ICD-10-CM

## 2023-10-10 ENCOUNTER — Ambulatory Visit: Admitting: Orthopaedic Surgery

## 2023-10-22 ENCOUNTER — Ambulatory Visit: Admitting: Orthopaedic Surgery

## 2023-10-22 DIAGNOSIS — R21 Rash and other nonspecific skin eruption: Secondary | ICD-10-CM | POA: Diagnosis not present

## 2023-10-22 DIAGNOSIS — Z87891 Personal history of nicotine dependence: Secondary | ICD-10-CM | POA: Diagnosis not present

## 2023-10-24 ENCOUNTER — Ambulatory Visit: Admitting: Orthopaedic Surgery

## 2023-10-24 ENCOUNTER — Encounter: Payer: Self-pay | Admitting: Orthopaedic Surgery

## 2023-10-24 ENCOUNTER — Other Ambulatory Visit (INDEPENDENT_AMBULATORY_CARE_PROVIDER_SITE_OTHER)

## 2023-10-24 DIAGNOSIS — M25512 Pain in left shoulder: Secondary | ICD-10-CM | POA: Diagnosis not present

## 2023-10-24 MED ORDER — LIDOCAINE HCL 1 % IJ SOLN
3.0000 mL | INTRAMUSCULAR | Status: AC | PRN
Start: 2023-10-24 — End: 2023-10-24
  Administered 2023-10-24: 3 mL

## 2023-10-24 MED ORDER — METHYLPREDNISOLONE ACETATE 40 MG/ML IJ SUSP
40.0000 mg | INTRAMUSCULAR | Status: AC | PRN
  Administered 2023-10-24: 40 mg via INTRA_ARTICULAR

## 2023-10-24 MED ORDER — BUPIVACAINE HCL 0.5 % IJ SOLN
3.0000 mL | INTRAMUSCULAR | Status: AC | PRN
  Administered 2023-10-24: 3 mL via INTRA_ARTICULAR

## 2023-10-24 NOTE — Progress Notes (Signed)
 Office Visit Note   Patient: Marie Kramer           Date of Birth: 02-05-1976           MRN: [id number] Visit Date: 10/24/2023              Requested by: Everlina Hock, NP [address] [place],  [place] [address] PCP: Everlina Hock, NP   Assessment & Plan: Visit Diagnoses:  1. Acute pain of left shoulder     Plan: Patient is a 48 year old female with left shoulder pain.  Her cuff is strong and functional.  Would favor something more inflammatory like bursitis or tendinitis.  Recommend subacromial injection, relative rest and exercise program which we have provided.  Questions encouraged and answered.  She tolerated the injection well.  Follow-up if symptoms do not improve  Follow-Up Instructions: No follow-ups on file.   Orders:  Orders Placed This Encounter  Procedures   XR Shoulder Left   No orders of the defined types were placed in this encounter.     Procedures: Large Joint Inj: L subacromial bursa on 10/24/2023 3:25 PM Indications: pain Details: 22 G needle  Arthrogram: No  Medications: 3 mL lidocaine  1 %; 3 mL bupivacaine  0.5 %; 40 mg methylPREDNISolone  acetate 40 MG/ML Outcome: tolerated well, no immediate complications Patient was prepped and draped in the usual sterile fashion.       Clinical Data: No additional findings.   Subjective: Chief Complaint  Patient presents with   Left Shoulder - Pain    HPI Patient is a 48 year old female here for evaluation of left shoulder pain for 2 weeks.  Denies any trauma.  Reports pain around the shoulder.  Feels like it is stiff.  Has not been taking any medications.  Denies any radicular symptoms.  Right-hand-dominant. Review of Systems  Constitutional: Negative.   HENT: Negative.    Eyes: Negative.   Respiratory: Negative.    Cardiovascular: Negative.   Endocrine: Negative.   Musculoskeletal: Negative.   Neurological: Negative.   Hematological: Negative.    Psychiatric/Behavioral: Negative.    All other systems reviewed and are negative.    Objective: Vital Signs: There were no vitals taken for this visit.  Physical Exam Vitals and nursing note reviewed.  Constitutional:      Appearance: She is well-developed.  HENT:     Head: Atraumatic.     Nose: Nose normal.  Eyes:     Extraocular Movements: Extraocular movements intact.  Cardiovascular:     Pulses: Normal pulses.  Pulmonary:     Effort: Pulmonary effort is normal.  Abdominal:     Palpations: Abdomen is soft.  Musculoskeletal:     Cervical back: Neck supple.  Skin:    General: Skin is warm.     Capillary Refill: Capillary refill takes less than 2 seconds.  Neurological:     Mental Status: She is alert. Mental status is at baseline.  Psychiatric:        Behavior: Behavior normal.        Thought Content: Thought content normal.        Judgment: Judgment normal.     Ortho Exam Exam of the left shoulder shows near full range of motion but somewhat limited secondary to pain.  She has pain with Neer impingement and Hawkins impingement.  Rotator cuff is strong to manual muscle testing.  She has diffuse tenderness throughout the shoulder. Specialty Comments:  No specialty comments  available.  Imaging: XR Shoulder Left Result Date: 10/24/2023 X-rays of the left shoulder show mild degenerative spurring of the anterior acromion.  No acute abnormalities.    PMFS History: Patient Active Problem List   Diagnosis Date Noted   Pure hypercholesterolemia 09/02/2023   Iron deficiency anemia 09/02/2023   Prediabetes 09/02/2023   Moderate persistent asthma 07/10/2018   Seasonal and perennial allergic rhinoconjunctivitis 07/10/2018   Past Medical History:  Diagnosis Date   Allergies    Anemia 04/21/1992   Asthma    Bronchitis    Chronic pain of left knee 06/19/2016   Haglund's deformity of right heel 06/13/2018    Family History  Problem Relation Age of Onset    Allergic rhinitis Mother    Asthma Son    Allergic rhinitis Son    Asthma Daughter    Allergic rhinitis Daughter    Bronchitis Daughter    Asthma Son    Allergic rhinitis Son    Eczema Neg Hx    Urticaria Neg Hx    Immunodeficiency Neg Hx    Angioedema Neg Hx     Past Surgical History:  Procedure Laterality Date   BREAST BIOPSY Left 11/15/2022   MM LT BREAST BX W LOC DEV 1ST LESION IMAGE BX SPEC STEREO GUIDE 11/15/2022 [facility]   TUBAL LIGATION     Social History   Occupational History   Not on file  Tobacco Use   Smoking status: Never   Smokeless tobacco: Never  Vaping Use   Vaping status: Never Used  Substance and Sexual Activity   Alcohol use: No   Drug use: No   Sexual activity: Yes    Birth control/protection: Condom, Surgical, None

## 2023-11-04 ENCOUNTER — Ambulatory Visit: Admitting: Obstetrics

## 2023-11-12 ENCOUNTER — Encounter: Payer: Self-pay | Admitting: Obstetrics

## 2023-11-14 ENCOUNTER — Ambulatory Visit: Admitting: Obstetrics

## 2023-11-14 ENCOUNTER — Encounter: Payer: Self-pay | Admitting: Obstetrics

## 2023-11-14 ENCOUNTER — Other Ambulatory Visit (HOSPITAL_COMMUNITY)
Admission: RE | Admit: 2023-11-14 | Discharge: 2023-11-14 | Disposition: A | Discharge status: Home / Self Care | Source: Ambulatory Visit | Attending: Obstetrics | Admitting: Obstetrics

## 2023-11-14 VITALS — BP 119/77 | HR 62 | Wt 191.2 lb

## 2023-11-14 DIAGNOSIS — Z113 Encounter for screening for infections with a predominantly sexual mode of transmission: Secondary | ICD-10-CM | POA: Diagnosis not present

## 2023-11-14 DIAGNOSIS — N946 Dysmenorrhea, unspecified: Secondary | ICD-10-CM | POA: Diagnosis not present

## 2023-11-14 DIAGNOSIS — Z1239 Encounter for other screening for malignant neoplasm of breast: Secondary | ICD-10-CM

## 2023-11-14 DIAGNOSIS — Z1331 Encounter for screening for depression: Secondary | ICD-10-CM

## 2023-11-14 DIAGNOSIS — Z124 Encounter for screening for malignant neoplasm of cervix: Secondary | ICD-10-CM | POA: Insufficient documentation

## 2023-11-14 DIAGNOSIS — B369 Superficial mycosis, unspecified: Secondary | ICD-10-CM

## 2023-11-14 DIAGNOSIS — Z01419 Encounter for gynecological examination (general) (routine) without abnormal findings: Secondary | ICD-10-CM | POA: Diagnosis not present

## 2023-11-14 DIAGNOSIS — Z1151 Encounter for screening for human papillomavirus (HPV): Secondary | ICD-10-CM | POA: Diagnosis not present

## 2023-11-14 DIAGNOSIS — J301 Allergic rhinitis due to pollen: Secondary | ICD-10-CM

## 2023-11-14 DIAGNOSIS — N898 Other specified noninflammatory disorders of vagina: Secondary | ICD-10-CM | POA: Insufficient documentation

## 2023-11-14 MED ORDER — IBUPROFEN 800 MG PO TABS: 800.0000 mg | ORAL_TABLET | Freq: Three times a day (TID) | ORAL | 5 refills | Status: AC | PRN

## 2023-11-14 MED ORDER — CICLOPIROX OLAMINE 0.77 % EX CREA
TOPICAL_CREAM | Freq: Two times a day (BID) | CUTANEOUS | 2 refills | Status: AC
Start: 2023-11-14 — End: ?

## 2023-11-14 MED ORDER — PNV-DHA 27-0.6-0.4-300 MG PO CAPS: 1.0000 | ORAL_CAPSULE | Freq: Every day | ORAL | 11 refills | Status: AC

## 2023-11-14 MED ORDER — LORATADINE 10 MG PO TABS: 10.0000 mg | ORAL_TABLET | Freq: Every day | ORAL | 11 refills | Status: DC

## 2023-11-14 NOTE — Progress Notes (Unsigned)
 Pt presents for annual. Pt is concerned about her iron levels. Pt only wants swab std testing. No pap

## 2023-11-14 NOTE — Progress Notes (Unsigned)
 Subjective:        Marie Kramer is a 48 y.o. female here for a routine exam.  Current complaints: Vaginal discharge.    Personal health questionnaire:  Is patient Ashkenazi Jewish, have a family history of breast and/or ovarian cancer: no Is there a family history of uterine cancer diagnosed at age < 1, gastrointestinal cancer, urinary tract cancer, family member who is a Personnel officer syndrome-associated carrier: no Is the patient overweight and hypertensive, family history of diabetes, personal history of gestational diabetes, preeclampsia or PCOS: no Is patient over 47, have PCOS,  family history of premature CHD under age 44, diabetes, smoke, have hypertension or peripheral artery disease:  no At any time, has a partner hit, kicked or otherwise hurt or frightened you?: no Over the past 2 weeks, have you felt down, depressed or hopeless?: no Over the past 2 weeks, have you felt little interest or pleasure in doing things?:no   Gynecologic History Patient's last menstrual period was 10/28/2023 (exact date). Contraception: tubal ligation Last Pap: 08/30/2022. Results were: normal Last mammogram: 10-29-2022. Results were: abnormal  Obstetric History OB History  Gravida Para Term Preterm AB Living  3     3  SAB IAB Ectopic Multiple Live Births          # Outcome Date GA Lbr Len/2nd Weight Sex Type Anes PTL Lv  3 Gravida           2 Gravida           1 [place]             Past Medical History:  Diagnosis Date   Allergies    Anemia 04/21/1992   Asthma    Bronchitis    Chronic pain of left knee 06/19/2016   Haglund's deformity of right heel 06/13/2018    Past Surgical History:  Procedure Laterality Date   BREAST BIOPSY Left 11/15/2022   MM LT BREAST BX W LOC DEV 1ST LESION IMAGE BX SPEC STEREO GUIDE 11/15/2022 [facility]   TUBAL LIGATION       Current Outpatient Medications:    albuterol  (PROAIR  HFA) 108 (90 Base) MCG/ACT inhaler, Inhale 2 puffs into the lungs  every 4 (four) hours as needed for wheezing or shortness of breath., Disp: 18 g, Rfl: 0   Albuterol  Sulfate 2.5 MG/0.5ML NEBU, Please specify directions, refills and quantity, Disp: 75 mL, Rfl: 1   ciclopirox (LOPROX) 0.77 % cream, Apply topically 2 (two) times daily., Disp: 90 g, Rfl: 2   Ferric Maltol  (ACCRUFER ) 30 MG CAPS, Take 1 capsule (30 mg total) by mouth 2 (two) times daily before a meal. Take 2 hrs before, or 2 hrs after a meal., Disp: 60 capsule, Rfl: 3   fluticasone  (FLOVENT  HFA) 110 MCG/ACT inhaler, Inhale 2 puffs into the lungs in the morning and at bedtime., Disp: 12 g, Rfl: 5   ibuprofen  (ADVIL ) 800 MG tablet, Take 1 tablet (800 mg total) by mouth every 8 (eight) hours as needed., Disp: 30 tablet, Rfl: 5   loratadine (CLARITIN) 10 MG tablet, Take 1 tablet (10 mg total) by mouth daily., Disp: 30 tablet, Rfl: 11   Prenat w/o A-FE-Methfol-FA-DHA (PNV-DHA) 27-0.6-0.4-300 MG CAPS, Take 1 capsule by mouth daily before breakfast., Disp: 30 capsule, Rfl: 11   rosuvastatin  (CRESTOR ) 40 MG tablet, Take 1 tablet (40 mg total) by mouth daily., Disp: 90 tablet, Rfl: 3   diclofenac  (VOLTAREN ) 75 MG EC tablet, Take 1 tablet (75 mg total) by  mouth 2 (two) times daily. (Patient not taking: Reported on 11/14/2023), Disp: 30 tablet, Rfl: 2   EPINEPHrine  (EPIPEN  2-PAK) 0.3 mg/0.3 mL IJ SOAJ injection, EpiPen  2-Pak 0.3 mg/0.3 mL injection, auto-injector (Patient not taking: Reported on 11/14/2023), Disp: 1 each, Rfl: 2 No Known Allergies  Social History   Tobacco Use   Smoking status: Never   Smokeless tobacco: Never  Substance Use Topics   Alcohol use: No    Family History  Problem Relation Age of Onset   Allergic rhinitis Mother    Asthma Son    Allergic rhinitis Son    Asthma Daughter    Allergic rhinitis Daughter    Bronchitis Daughter    Asthma Son    Allergic rhinitis Son    Eczema Neg Hx    Urticaria Neg Hx    Immunodeficiency Neg Hx    Angioedema Neg Hx       Review of  Systems  Constitutional: negative for fatigue and weight loss Respiratory: negative for cough and wheezing Cardiovascular: negative for chest pain, fatigue and palpitations Gastrointestinal: negative for abdominal pain and change in bowel habits Musculoskeletal:negative for myalgias Neurological: negative for gait problems and tremors Behavioral/Psych: negative for abusive relationship, depression Endocrine: negative for temperature intolerance    Genitourinary: positive for vaginal discharge.  negative for abnormal menstrual periods, genital lesions, hot flashes, sexual problems  Integument/breast: negative for breast lump, breast tenderness, nipple discharge and skin lesion(s)    Objective:       BP 119/77   Pulse 62   Wt 191 lb 3.2 oz (86.7 kg)   LMP 10/28/2023 (Exact Date)   BMI 31.82 kg/m  General:   Alert and no distress  Skin:   no rash or abnormalities  Lungs:   clear to auscultation bilaterally  Heart:   regular rate and rhythm, S1, S2 normal, no murmur, click, rub or gallop  Breasts:   normal without suspicious masses, skin or nipple changes or axillary nodes  Abdomen:  normal findings: no organomegaly, soft, non-tender and no hernia  Pelvis:  External genitalia: normal general appearance Urinary system: urethral meatus normal and bladder without fullness, nontender Vaginal: normal without tenderness, induration or masses Cervix: normal appearance Adnexa: normal bimanual exam Uterus: anteverted and non-tender, normal size   Lab Review Urine pregnancy test Labs reviewed yes Radiologic studies reviewed yes  I have spent a total of 20 minutes of face-to-face time, excluding clinical staff time, reviewing notes and preparing to see patient, ordering tests and/or medications, and counseling the patient.   Assessment:    1. Encounter for gynecological examination with Papanicolaou smear of cervix (Primary) Rx: - Cytology - PAP( [place]) - CBC - Ferritin -  Prenat w/o A-FE-Methfol-FA-DHA (PNV-DHA) 27-0.6-0.4-300 MG CAPS; Take 1 capsule by mouth daily before breakfast.  Dispense: 30 capsule; Refill: 11  2. Dysmenorrhea Rx: - ibuprofen  (ADVIL ) 800 MG tablet; Take 1 tablet (800 mg total) by mouth every 8 (eight) hours as needed.  Dispense: 30 tablet; Refill: 5  3. Vaginal discharge Rx: - Cervicovaginal ancillary only( [place])  4. Superficial fungus infection of skin Rx: - ciclopirox (LOPROX) 0.77 % cream; Apply topically 2 (two) times daily.  Dispense: 90 g; Refill: 2  5. Screening breast examination Rx: - MM 3D SCREENING MAMMOGRAM BILATERAL BREAST; Future  6. Seasonal allergic rhinitis due to pollen Rx: - loratadine (CLARITIN) 10 MG tablet; Take 1 tablet (10 mg total) by mouth daily.  Dispense: 30 tablet; Refill: 11  Plan:    Education reviewed: calcium  supplements, depression evaluation, low fat, low cholesterol diet, safe sex/STD prevention, self breast exams, and weight bearing exercise. Mammogram ordered. Follow up in: 1 year.   Meds ordered this encounter  Medications   ciclopirox (LOPROX) 0.77 % cream    Sig: Apply topically 2 (two) times daily.    Dispense:  90 g    Refill:  2   ibuprofen  (ADVIL ) 800 MG tablet    Sig: Take 1 tablet (800 mg total) by mouth every 8 (eight) hours as needed.    Dispense:  30 tablet    Refill:  5   loratadine (CLARITIN) 10 MG tablet    Sig: Take 1 tablet (10 mg total) by mouth daily.    Dispense:  30 tablet    Refill:  11   Prenat w/o A-FE-Methfol-FA-DHA (PNV-DHA) 27-0.6-0.4-300 MG CAPS    Sig: Take 1 capsule by mouth daily before breakfast.    Dispense:  30 capsule    Refill:  11   Orders Placed This Encounter  Procedures   MM 3D SCREENING MAMMOGRAM BILATERAL BREAST    ZOX:[id number] ANNUAL// NO PROBLEMS// NO HX OF BREAST CANCER  PREV: 10-29-22 [facility]  NO NEEDS EG SW PT  PT AWARE $75 NO SHOW/CANCELLATION FEE WITHIN 24 HOURS    Standing Status:   Future    Expiration  Date:   11/13/2024    Reason for Exam (SYMPTOM  OR DIAGNOSIS REQUIRED):   Screening    Is the patient pregnant?:   No    Preferred imaging location?:   [facility]   CBC   Ferritin    Gabrielle Joiner, MD, FACOG Attending Obstetrician & Gynecologist, [facility] for [facility], [facility], [place] 11/14/2023

## 2023-11-15 ENCOUNTER — Other Ambulatory Visit: Payer: Self-pay | Admitting: Obstetrics

## 2023-11-15 LAB — CERVICOVAGINAL ANCILLARY ONLY
Bacterial Vaginitis (gardnerella): NEGATIVE
Candida Glabrata: NEGATIVE
Candida Vaginitis: POSITIVE — AB
Chlamydia: NEGATIVE
Comment: NEGATIVE
Comment: NEGATIVE
Comment: NEGATIVE
Comment: NEGATIVE
Comment: NEGATIVE
Comment: NORMAL
Neisseria Gonorrhea: NEGATIVE
Trichomonas: NEGATIVE

## 2023-11-15 NOTE — Telephone Encounter (Signed)
 No iron infusion needed.  Continue Accrufer .  Repeat CBC and Ferritin in 3 months.

## 2023-11-18 LAB — CYTOLOGY - PAP
Comment: NEGATIVE
Diagnosis: NEGATIVE
High risk HPV: NEGATIVE

## 2023-11-19 ENCOUNTER — Ambulatory Visit: Payer: Self-pay | Admitting: Family Medicine

## 2023-11-26 ENCOUNTER — Other Ambulatory Visit: Payer: Self-pay | Admitting: Obstetrics

## 2023-11-26 ENCOUNTER — Encounter: Payer: Self-pay | Admitting: Obstetrics

## 2023-11-26 ENCOUNTER — Ambulatory Visit
Admission: RE | Admit: 2023-11-26 | Discharge: 2023-11-26 | Disposition: A | Discharge status: Home / Self Care | Source: Ambulatory Visit | Attending: Obstetrics | Admitting: Obstetrics

## 2023-11-26 DIAGNOSIS — R234 Changes in skin texture: Secondary | ICD-10-CM

## 2023-11-26 DIAGNOSIS — Z1239 Encounter for other screening for malignant neoplasm of breast: Secondary | ICD-10-CM

## 2023-12-17 DIAGNOSIS — Z8709 Personal history of other diseases of the respiratory system: Secondary | ICD-10-CM | POA: Diagnosis not present

## 2023-12-17 DIAGNOSIS — J45909 Unspecified asthma, uncomplicated: Secondary | ICD-10-CM | POA: Diagnosis not present

## 2023-12-17 DIAGNOSIS — R0602 Shortness of breath: Secondary | ICD-10-CM | POA: Diagnosis not present

## 2023-12-17 DIAGNOSIS — J45901 Unspecified asthma with (acute) exacerbation: Secondary | ICD-10-CM | POA: Diagnosis not present

## 2023-12-17 DIAGNOSIS — R059 Cough, unspecified: Secondary | ICD-10-CM | POA: Diagnosis not present

## 2023-12-18 ENCOUNTER — Other Ambulatory Visit: Payer: Self-pay | Admitting: Obstetrics

## 2023-12-18 DIAGNOSIS — R234 Changes in skin texture: Secondary | ICD-10-CM

## 2023-12-20 ENCOUNTER — Ambulatory Visit: Admission: RE | Admit: 2023-12-20 | Source: Ambulatory Visit

## 2023-12-20 ENCOUNTER — Ambulatory Visit
Admission: RE | Admit: 2023-12-20 | Discharge: 2023-12-20 | Disposition: A | Discharge status: Home / Self Care | Source: Ambulatory Visit | Attending: Obstetrics | Admitting: Obstetrics

## 2023-12-20 DIAGNOSIS — J029 Acute pharyngitis, unspecified: Secondary | ICD-10-CM | POA: Diagnosis not present

## 2023-12-20 DIAGNOSIS — R051 Acute cough: Secondary | ICD-10-CM | POA: Diagnosis not present

## 2023-12-20 DIAGNOSIS — R928 Other abnormal and inconclusive findings on diagnostic imaging of breast: Secondary | ICD-10-CM | POA: Diagnosis not present

## 2023-12-20 DIAGNOSIS — R234 Changes in skin texture: Secondary | ICD-10-CM

## 2024-01-17 ENCOUNTER — Ambulatory Visit: Payer: Self-pay | Admitting: General Surgery

## 2024-01-17 DIAGNOSIS — N6489 Other specified disorders of breast: Secondary | ICD-10-CM | POA: Diagnosis not present

## 2024-01-20 ENCOUNTER — Encounter: Payer: Self-pay | Admitting: Internal Medicine

## 2024-01-20 ENCOUNTER — Ambulatory Visit (INDEPENDENT_AMBULATORY_CARE_PROVIDER_SITE_OTHER): Admitting: Internal Medicine

## 2024-01-20 VITALS — BP 102/60 | HR 60 | Temp 98.0°F | Resp 16 | Ht 66.0 in | Wt 195.0 lb

## 2024-01-20 DIAGNOSIS — H1013 Acute atopic conjunctivitis, bilateral: Secondary | ICD-10-CM | POA: Diagnosis not present

## 2024-01-20 DIAGNOSIS — J302 Other seasonal allergic rhinitis: Secondary | ICD-10-CM | POA: Insufficient documentation

## 2024-01-20 DIAGNOSIS — J4540 Moderate persistent asthma, uncomplicated: Secondary | ICD-10-CM

## 2024-01-20 DIAGNOSIS — L853 Xerosis cutis: Secondary | ICD-10-CM | POA: Diagnosis not present

## 2024-01-20 DIAGNOSIS — J3089 Other allergic rhinitis: Secondary | ICD-10-CM | POA: Diagnosis not present

## 2024-01-20 MED ORDER — BUDESONIDE-FORMOTEROL FUMARATE 80-4.5 MCG/ACT IN AERO: 2.0000 | INHALATION_SPRAY | Freq: Two times a day (BID) | RESPIRATORY_TRACT | 5 refills | Status: AC

## 2024-01-20 MED ORDER — LEVOCETIRIZINE DIHYDROCHLORIDE 5 MG PO TABS: 5.0000 mg | ORAL_TABLET | Freq: Every evening | ORAL | Status: AC

## 2024-01-20 MED ORDER — FLUTICASONE PROPIONATE 50 MCG/ACT NA SUSP: 1.0000 | Freq: Every day | NASAL | 5 refills | Status: AC

## 2024-01-20 MED ORDER — CROMOLYN SODIUM 4 % OP SOLN: 1.0000 [drp] | Freq: Four times a day (QID) | OPHTHALMIC | 3 refills | Status: AC | PRN

## 2024-01-20 NOTE — Patient Instructions (Signed)
 Asthma: mild persistent Asthma exacerbation with increased symptoms and frequent albuterol  use. Not well-controlled despite clear lung examination. - your lung testing today looked great - Controller Inhaler: Start Symbicort  80 mcg 2 puffs twice a day; This Should Be Used Everyday - Rinse mouth out after use - During respiratory illness or asthma flares: Increase symbicort  80 mcg 3 puffs twice daily  and continue for 2 weeks or until symptoms resolve. - Rescue Inhaler: Albuterol  (Proair /Ventolin ) 2 puffs . Use  every 4-6 hours as needed for chest tightness, wheezing, or coughing.   Can also use 15 minutes prior to exercise if you have symptoms with activity. - Asthma is not controlled if:  - Symptoms are occurring >2 times a week OR  - >2 times a month nighttime awakenings  - You are requiring systemic steroids (prednisone /steroid injections) more than once per year  - Your require hospitalization for your asthma.  - Please call the clinic to schedule a follow up if these symptoms arise Avoid smoke exposure Stay up-to-date with your annual flu vaccines, COVID vaccines and pneumonia vaccines when indicated.   Seasonal Allergic Rhinitis Persistent symptoms exacerbated by dust. Previous Xyzal  and Flonase  use was beneficial. - Restart Xyzal  5 mg daily as needed and Flonase  1 spray daily. - Prescribed cromolyn  eye drops, up to four times daily as needed - Scheduled allergy  retesting for environmental allergens on Monday, January 27, 2024, at 9:30 AM. - Instructed to stop Xyzal  three days before allergy  testing.  Dry Skin on Elbows Dry skin on arms with occasional pruritus, managed with Eucerin. - Provided samples of topical vanicream to be used as needed for dry itchy skin.  - use hypoallergenic ointments for best results at least twice daily   Follow up : next Monday July 28th at 9:30 AM (stop Xyzal  3 days prior to visit) 1-55 It was a pleasure meeting you in clinic today! Thank you for  allowing me to participate in your care.  Rocky Endow, MD [facility]

## 2024-01-20 NOTE — Progress Notes (Signed)
 NEW PATIENT Date of Service/Encounter:   01/20/2024 Referring provider: none-self referred Primary care provider: Almarie Waddell NOVAK, NP  Subjective:  Marie Kramer is a 48 y.o. female with a PMHx of prediabetes, hypercholesterolemia presenting today for evaluation of asthma, allergic rhinitis History obtained from: chart review and patient.   Discussed the use of AI scribe software for clinical note transcription with the patient, who gave verbal consent to proceed.  History of Present Illness Marie Kramer is a 48 year old female with asthma who presents with worsening asthma symptoms.  Asthma symptoms and exacerbations - Worsening asthma symptoms over the past month, including persistent coughing, chest tightness, and wheezing - Symptoms triggered by dust, illness, strenuous exercise, and singing on Sundays - Increased use of Albuterol  and nebulizer, currently every three days and sometimes daily depending on activities and environment - No current use of Flovent  inhaler; last prescription was over three years ago - Slight reduction in coughing episodes when previously using daily Flovent  - Three to four emergency room visits in the past year for asthma exacerbations - Two courses of prednisone  or Decadron  in the past four months - No history of hospitalization for asthma  Allergic rhinitis and ocular allergy  symptoms - Rhinorrhea, congestion, and itchy, watery eyes for the past month, typically recurring every other month due to seasonal allergies - Wheezing associated with allergy  symptoms - Relief with Xyzal ; not currently using nasal sprays or eye drops - Previous use of Flonase  nasal spray with good effect  Dry skin on elbows - dry skin localized to the arms - Manages symptoms with Eucerin to maintain skin moisture and prevent pruritus when skin is very dry  Environmental and occupational exposures - Works as a Naval architect - No tobacco use or exposure  to secondhand smoke  Gastrointestinal and food allergies - No history of acid reflux, heartburn, or food allergies  Vaccines are up-to-date  Chart Review:  ER evaluation 12/17/2023 for asthma.  Chest x-ray obtained and normal.  She was given a burst of prednisone  and albuterol  was refilled. She is a previous patient of our clinic, last evaluation in April 2022 by Dr.Kim; lost to follow-up.  Followed for moderate persistent asthma and allergic rhinoconjunctivitis. Her asthma was not controlled at that time and she was started on Flovent  2 puffs twice a day. 2020 skin testing was positive to grass, mold, ragweed, weed, tree, cat, dog, cockroach and dust mites. 2022 skin testing positive to grass and dust mites. Instructed to start Xyzal , Flonase  nasal spray, olopatadine  eyedrops.  Consider allergy  injections.   Past Medical History: Past Medical History:  Diagnosis Date   Allergies    Anemia 04/21/1992   Asthma    Bronchitis    Chronic pain of left knee 06/19/2016   Haglund's deformity of right heel 06/13/2018   Medication List:  Current Outpatient Medications  Medication Sig Dispense Refill   albuterol  (PROAIR  HFA) 108 (90 Base) MCG/ACT inhaler Inhale 2 puffs into the lungs every 4 (four) hours as needed for wheezing or shortness of breath. 18 g 0   albuterol  (PROVENTIL ) (2.5 MG/3ML) 0.083% nebulizer solution Take 2.5 mg by nebulization every 6 (six) hours as needed for wheezing or shortness of breath.     benzonatate  (TESSALON ) 200 MG capsule Take 200 mg by mouth 3 (three) times daily.     ciclopirox  (LOPROX ) 0.77 % cream Apply topically 2 (two) times daily. 90 g 2   EPINEPHrine  (EPIPEN  2-PAK) 0.3 mg/0.3 mL  IJ SOAJ injection EpiPen  2-Pak 0.3 mg/0.3 mL injection, auto-injector 1 each 2   Ferric Maltol  (ACCRUFER ) 30 MG CAPS Take 1 capsule (30 mg total) by mouth 2 (two) times daily before a meal. Take 2 hrs before, or 2 hrs after a meal. 60 capsule 3   fluticasone  (FLOVENT  HFA) 110  MCG/ACT inhaler Inhale 2 puffs into the lungs in the morning and at bedtime. 12 g 5   ibuprofen  (ADVIL ) 800 MG tablet Take 1 tablet (800 mg total) by mouth every 8 (eight) hours as needed. 30 tablet 5   loratadine  (CLARITIN ) 10 MG tablet Take 1 tablet (10 mg total) by mouth daily. 30 tablet 11   metroNIDAZOLE  (FLAGYL ) 500 MG tablet Take 500 mg by mouth 2 (two) times daily.     Prenat w/o A-FE-Methfol-FA-DHA (PNV-DHA ) 27-0.6-0.4-300 MG CAPS Take 1 capsule by mouth daily before breakfast. 30 capsule 11   rosuvastatin  (CRESTOR ) 40 MG tablet Take 1 tablet (40 mg total) by mouth daily. 90 tablet 3   Albuterol  Sulfate 2.5 MG/0.5ML NEBU Please specify directions, refills and quantity 75 mL 1   No current facility-administered medications for this visit.   Known Allergies:  No Known Allergies Past Surgical History: Past Surgical History:  Procedure Laterality Date   BREAST BIOPSY Left 11/15/2022   MM LT BREAST BX W LOC DEV 1ST LESION IMAGE BX SPEC STEREO GUIDE 11/15/2022 [facility]MO[facility]   TUBAL LIGATION     Family History: Family History  Problem Relation Age of Onset   Allergic rhinitis Mother    Asthma Son    Allergic rhinitis Son    Asthma Daughter    Allergic rhinitis Daughter    Bronchitis Daughter    Asthma Son    Allergic rhinitis Son    Eczema Neg Hx    Urticaria Neg Hx    Immunodeficiency Neg Hx    Angioedema Neg Hx    Social History: Marie Kramer lives in an apartment without water damage, carpet in bedroom, gas heating, central AC, outdoor dog, not using dust mite covers in the bed with pillows, no smoke exposure.  Works as a Naval architect.  She is exposed to fumes chemicals or dust at her job, no HEPA filter in the home.  [place]  Area.   ROS:  All other systems negative except as noted per HPI.  Objective:  Blood pressure 102/60, pulse 60, temperature 98 F (36.7 C), temperature source Temporal, resp. rate 16, height 5' 6 (1.676 m), weight 195 lb  (88.5 kg), SpO2 98%. Body mass index is 31.47 kg/m. Physical Exam:  General Appearance:  Alert, cooperative, no distress, appears stated age  Head:  Normocephalic, without obvious abnormality, atraumatic  Eyes:  Conjunctiva clear, EOM's intact  Ears EACs normal bilaterally and normal TMs bilaterally  Nose: Nares normal, hypertrophic turbinates, normal mucosa, and no visible anterior polyps  Throat: Lips, tongue normal; teeth and gums normal, normal posterior oropharynx  Neck: Supple, symmetrical  Lungs:   clear to auscultation bilaterally, Respirations unlabored, no coughing  Heart:  regular rate and rhythm and no murmur, Appears well perfused  Extremities: No edema  Skin: Flesh colored papules on bilateral extensor surfaces of elbows  Neurologic: No gross deficits   Diagnostics: Spirometry:  Tracings reviewed. Her effort: Good reproducible efforts. FVC: 2.39L  FEV1: 2.08L, 85% predicted  FEV1/FVC ratio: 0.87  Interpretation: Spirometry consistent with normal pattern.  Please see scanned spirometry results for details.  Labs:  Lab Orders  No laboratory  test(s) ordered today     Assessment and Plan  Assessment and Plan Assessment & Plan Asthma: mild persistent Asthma exacerbation with increased symptoms and frequent albuterol  use. Not well-controlled despite clear lung examination. - your lung testing today looked great - Controller Inhaler: Start Symbicort  80 mcg 2 puffs twice a day; This Should Be Used Everyday - Rinse mouth out after use - During respiratory illness or asthma flares: Increase symbicort  80 mcg 3 puffs twice daily  and continue for 2 weeks or until symptoms resolve. - Rescue Inhaler: Albuterol  (Proair /Ventolin ) 2 puffs . Use  every 4-6 hours as needed for chest tightness, wheezing, or coughing.   Can also use 15 minutes prior to exercise if you have symptoms with activity. - Asthma is not controlled if:  - Symptoms are occurring >2 times a week OR  - >2  times a month nighttime awakenings  - You are requiring systemic steroids (prednisone /steroid injections) more than once per year  - Your require hospitalization for your asthma.  - Please call the clinic to schedule a follow up if these symptoms arise Avoid smoke exposure Stay up-to-date with your annual flu vaccines, COVID vaccines and pneumonia vaccines when indicated.   Seasonal Allergic Rhinitis Persistent symptoms exacerbated by dust. Previous Xyzal  and Flonase  use was beneficial. - Restart Xyzal  5 mg daily as needed and Flonase  1 spray daily. - Prescribed cromolyn  eye drops, up to four times daily as needed - Scheduled allergy  retesting for environmental allergens on Monday, January 27, 2024, at 9:30 AM. - Instructed to stop Xyzal  three days before allergy  testing.  Dry Skin on Elbows Dry skin on arms with occasional pruritus, managed with Eucerin. - Provided samples of topical vanicream to be used as needed for dry itchy skin.  - use hypoallergenic ointments for best results at least twice daily   Follow up : next Monday July 28th at 9:30 AM (stop Xyzal  3 days prior to visit) 1-55 It was a pleasure meeting you in clinic today! Thank you for allowing me to participate in your care.  Rocky Endow, MD [facility]      This note in its entirety was forwarded to the Provider who requested this consultation.  Other: samples provided of   Thank you for your kind referral. I appreciate the opportunity to take part in Marie Kramer's care. Please do not hesitate to contact me with questions.  Sincerely,  Rocky Endow, MD [facility]  

## 2024-01-27 ENCOUNTER — Encounter: Payer: Self-pay | Admitting: Internal Medicine

## 2024-01-27 ENCOUNTER — Ambulatory Visit (INDEPENDENT_AMBULATORY_CARE_PROVIDER_SITE_OTHER): Admitting: Internal Medicine

## 2024-01-27 ENCOUNTER — Ambulatory Visit: Payer: Self-pay | Admitting: Internal Medicine

## 2024-01-27 DIAGNOSIS — J3089 Other allergic rhinitis: Secondary | ICD-10-CM

## 2024-01-27 DIAGNOSIS — J302 Other seasonal allergic rhinitis: Secondary | ICD-10-CM | POA: Diagnosis not present

## 2024-01-27 NOTE — Progress Notes (Signed)
 Aeroallergen Immunotherapy  Ordering Provider: Dr. Rocky Endow  Patient Details Name: Marie Kramer MRN: [id number] Date of Birth: 02-16-1976  Order 1 of 1  Vial Label: G/T/DM  0.3 ml (Volume)  BAU Concentration -- 7 Grass Mix* 100,000 ([place]  Blue, [place], [place], Perennial [place], RedTop, Sweet Vernal, Timothy) 0.2 ml (Volume)  1:20 Concentration -- [place] 0.2 ml (Volume)  1:20 Concentration -- [place], Eastern* 0.3 ml (Volume)  1:10 Concentration -- Oak, [place] mix* 0.2 ml (Volume)  1:10 Concentration -- Pecan Pollen 0.2 ml (Volume)  1:20 Concentration -- Red Mulberry 0.2 ml (Volume)  1:10 Concentration -- Sycamore Eastern* 0.2 ml (Volume)  1:20 Concentration -- Walnut, Black Pollen 0.5 ml (Volume)   AU Concentration -- Mite Mix (DF 5,000 & DP 5,000)   2.3  ml Extract Subtotal 2.7  ml Diluent 5.0  ml Maintenance Total  Schedule:  B Blue Vial (1:100,000): Schedule B (6 doses) Yellow Vial (1:10,000): Schedule B (6 doses) Green Vial (1:1,000): Schedule B (6 doses) Red Vial (1:100): Schedule A (14 doses)  Special Instructions: After completion of the first Red Vial, please space to every two weeks. After completion of the second Red Vial, please space to every 4 weeks. Ok to up dose new vials at 0.99mL --> 0.3 mL --> 0.5 mL. Ok to come twice weekly except in red vial, if desired, as long as there is 48 hours between injections

## 2024-01-27 NOTE — Patient Instructions (Addendum)
 Asthma: mild persistent Asthma exacerbation with increased symptoms and frequent albuterol  use. Not well-controlled despite clear lung examination. - your lung testing 01/20/24 looked great - Controller Inhaler: Symbicort  80 mcg 2 puffs twice a day; This Should Be Used Everyday - Rinse mouth out after use - During respiratory illness or asthma flares: Increase symbicort  80 mcg 3 puffs twice daily  and continue for 2 weeks or until symptoms resolve. - Rescue Inhaler: Albuterol  (Proair /Ventolin ) 2 puffs . Use  every 4-6 hours as needed for chest tightness, wheezing, or coughing.   Can also use 15 minutes prior to exercise if you have symptoms with activity. - Asthma is not controlled if:  - Symptoms are occurring >2 times a week OR  - >2 times a month nighttime awakenings  - You are requiring systemic steroids (prednisone /steroid injections) more than once per year  - Your require hospitalization for your asthma.  - Please call the clinic to schedule a follow up if these symptoms arise Avoid smoke exposure Stay up-to-date with your annual flu vaccines, COVID vaccines and pneumonia vaccines when indicated.   Seasonal Allergic Rhinitis Persistent symptoms exacerbated by dust. Previous Xyzal  and Flonase  use was beneficial. - Restart Xyzal  5 mg daily as needed and Flonase  1 spray daily. - Prescribed cromolyn  eye drops, up to four times daily as needed - skin testing to environmental panel 01/27/24: positive to tree pollen, dust mites; Intradermal testing positive to grass pollen.; allergen avoidance -Consider allergy  injections to reduce lifetime symptoms and need for medications by teaching your immune system to become tolerant of the environmental allergens you are allergic to   Dry Skin on Elbows Dry skin on arms with occasional pruritus, managed with Eucerin. - Provided samples of topical vanicream to be used as needed for dry itchy skin.  - use hypoallergenic ointments for best results at  least twice daily   Follow up : 3 months, sooner if needed.  Sooner to start allergy  injections-consent signed today. It was a pleasure seeing you again in clinic today! Thank you for allowing me to participate in your care.  Rocky Endow, MD [facility]  Reducing Pollen Exposure  The American Academy of Allergy , Asthma and Immunology suggests the following steps to reduce your exposure to pollen during allergy  seasons.    Do not hang sheets or clothing out to dry; pollen may collect on these items. Do not mow lawns or spend time around freshly cut grass; mowing stirs up pollen. Keep windows closed at night.  Keep car windows closed while driving. Minimize morning activities outdoors, a time when pollen counts are usually at their highest. Stay indoors as much as possible when pollen counts or humidity is high and on windy days when pollen tends to remain in the air longer. Use air conditioning when possible.  Many air conditioners have filters that trap the pollen spores. Use a HEPA room air filter to remove pollen form the indoor air you breathe. DUST MITE AVOIDANCE MEASURES:  There are three main measures that need and can be taken to avoid house dust mites:  Reduce accumulation of dust in general -reduce furniture, clothing, carpeting, books, stuffed animals, especially in bedroom  Separate yourself from the dust -use pillow and mattress encasements (can be found at stores such as [employer] or online) -avoid direct exposure to air condition flow -use a HEPA filter device, especially in the bedroom; you can also use a HEPA filter vacuum cleaner -wipe dust  with a moist towel instead of a dry towel or broom when cleaning  Decrease mites and/or their secretions -wash clothing and linen and stuffed animals at highest temperature possible, at least every 2 weeks -stuffed animals can also be placed in a bag and put in a freezer overnight  Despite the  above measures, it is impossible to eliminate dust mites or their allergen completely from your home.  With the above measures the burden of mites in your home can be diminished, with the goal of minimizing your allergic symptoms.  Success will be reached only when implementing and using all means together.

## 2024-01-27 NOTE — Addendum Note (Signed)
 Addended by: [employer] N on: 01/27/2024 01:41 PM   Modules accepted: Orders

## 2024-01-27 NOTE — Progress Notes (Signed)
 Date of Service/Encounter:  01/27/24  Allergy  testing appointment   Initial visit on 01/20/24, seen for allergic rhinitis and mild persistent asthma.  Please see that note for additional details.  Today reports for allergy  diagnostic testing:    DIAGNOSTICS:  Skin Testing: Environmental allergy  panel. Adequate positive and negative controls. Results discussed with patient/family.  Airborne Adult Perc - 01/27/24 0932     Time Antigen Placed 0932    Allergen Manufacturer Jestine    Location Back    Number of Test 55    1. Control-Buffer 50% Glycerol Negative    2. Control-Histamine 3+    3. Bahia Negative    4. French Southern Territories Negative    5. Johnson Negative    6. [place]  Blue Negative    7. Meadow Fescue Negative    8. Perennial Rye Negative    9. Timothy Negative    10. Ragweed Mix Negative    11. Cocklebur Negative    12. Plantain,  English Negative    13. Baccharis Negative    14. Dog Fennel Negative    15. Russian Thistle Negative    16. Lamb's Quarters Negative    17. Sheep Sorrell Negative    18. Rough Pigweed Negative    19. Marsh Elder, Rough Negative    20. Mugwort, Common Negative    21. Box, Elder Negative    22. Cedar, red Negative    23. Sweet Gum Negative    24. Pecan Pollen 3+    25. Pine Mix Negative    26. Walnut, Black Pollen 3+    27. Red Mulberry 2+    28. Ash Mix Negative    29. Birch Mix Negative    30. Beech American Negative    31. Cottonwood, Eastern 3+    32. Hickory, White Negative    33. Maple Mix Negative    34. Oak, Guinea-Bissau Mix 3+    35. Sycamore Eastern 3+    36. Alternaria Alternata Negative    37. Cladosporium Herbarum Negative    38. Aspergillus Mix Negative    39. Penicillium Mix Negative    40. Bipolaris Sorokiniana (Helminthosporium) Negative    41. Drechslera Spicifera (Curvularia) Negative    42. Mucor Plumbeus Negative    43. Fusarium Moniliforme 3+    44. Aureobasidium Pullulans (pullulara) Negative    45. Rhizopus  Oryzae Negative    46. Botrytis Cinera Negative    47. Epicoccum Nigrum Negative    48. Phoma Betae Negative    49. Dust Mite Mix 4+    50. Cat Hair 10,000 BAU/ml Negative    51.  Dog Epithelia Negative    52. Mixed Feathers Negative    53. Horse Epithelia Negative    54. Cockroach, German Negative    55. Tobacco Leaf Negative          Intradermal - 01/27/24 1003     Time Antigen Placed 1003    Allergen Manufacturer Greer    Location Arm    Number of Test 13    Control Negative    Bahia 4+    French Southern Territories Negative    Johnson Negative    7 Grass 4+    Ragweed Mix Negative    Weed Mix Negative    Mold 1 Negative    Mold 2 Negative    Mold 3 Negative    Cat Negative    Dog Negative    Cockroach Negative  Allergy  testing results were read and interpreted by myself, documented by clinical staff.  Patient provided with copy of allergy  testing along with avoidance measures when indicated.   Rocky Endow, MD  [facility]    Assessment and Plan:  Asthma: mild persistent Asthma exacerbation with increased symptoms and frequent albuterol  use. Not well-controlled despite clear lung examination. - your lung testing 01/20/24 looked great - Controller Inhaler: Symbicort  80 mcg 2 puffs twice a day; This Should Be Used Everyday - Rinse mouth out after use - During respiratory illness or asthma flares: Increase symbicort  80 mcg 3 puffs twice daily  and continue for 2 weeks or until symptoms resolve. - Rescue Inhaler: Albuterol  (Proair /Ventolin ) 2 puffs . Use  every 4-6 hours as needed for chest tightness, wheezing, or coughing.   Can also use 15 minutes prior to exercise if you have symptoms with activity. - Asthma is not controlled if:  - Symptoms are occurring >2 times a week OR  - >2 times a month nighttime awakenings  - You are requiring systemic steroids (prednisone /steroid injections) more than once per year  - Your require  hospitalization for your asthma.  - Please call the clinic to schedule a follow up if these symptoms arise Avoid smoke exposure Stay up-to-date with your annual flu vaccines, COVID vaccines and pneumonia vaccines when indicated.   Seasonal Allergic Rhinitis Persistent symptoms exacerbated by dust. Previous Xyzal  and Flonase  use was beneficial. - Restart Xyzal  5 mg daily as needed and Flonase  1 spray daily. - Prescribed cromolyn  eye drops, up to four times daily as needed - skin testing to environmental panel 01/27/24: positive to tree pollen, dust mites; Intradermal testing positive to grass pollen.; allergen avoidance -Consider allergy  injections to reduce lifetime symptoms and need for medications by teaching your immune system to become tolerant of the environmental allergens you are allergic to   Dry Skin on Elbows Dry skin on arms with occasional pruritus, managed with Eucerin. - Provided samples of topical vanicream to be used as needed for dry itchy skin.  - use hypoallergenic ointments for best results at least twice daily   Follow up : 3 months, sooner if needed.  Sooner to start allergy  injections-consent signed today. It was a pleasure seeing you again in clinic today! Thank you for allowing me to participate in your care.

## 2024-01-27 NOTE — Progress Notes (Signed)
MAKE VIALS CLOSER TO APPT.

## 2024-01-29 DIAGNOSIS — J3089 Other allergic rhinitis: Secondary | ICD-10-CM | POA: Diagnosis not present

## 2024-01-30 DIAGNOSIS — R231 Pallor: Secondary | ICD-10-CM | POA: Diagnosis not present

## 2024-02-18 ENCOUNTER — Ambulatory Visit (INDEPENDENT_AMBULATORY_CARE_PROVIDER_SITE_OTHER)

## 2024-02-18 DIAGNOSIS — J309 Allergic rhinitis, unspecified: Secondary | ICD-10-CM | POA: Diagnosis not present

## 2024-02-18 NOTE — Progress Notes (Signed)
 Immunotherapy   Patient Details  Name: Marie Kramer MRN: [id number] Date of Birth: [birth year]  02/18/2024  Marie Kramer Marie Kramer started injections for G-T-DM. Patient received 0.05 ml of her blue vial with an exp of 01/28/2025. Patient waited 30 minutes with no problems.  Following schedule: B  Frequency:2 times per week Epi-Pen:Epi-Pen Available  Consent signed and patient instructions given.   Marie Kramer Marie Kramer 02/18/2024, 8:30 AM

## 2024-02-25 ENCOUNTER — Ambulatory Visit (INDEPENDENT_AMBULATORY_CARE_PROVIDER_SITE_OTHER)

## 2024-02-25 DIAGNOSIS — J309 Allergic rhinitis, unspecified: Secondary | ICD-10-CM | POA: Diagnosis not present

## 2024-02-27 ENCOUNTER — Other Ambulatory Visit: Payer: Self-pay

## 2024-02-27 ENCOUNTER — Other Ambulatory Visit: Payer: Self-pay | Admitting: Obstetrics

## 2024-02-27 MED ORDER — METRONIDAZOLE 500 MG PO TABS: 500.0000 mg | ORAL_TABLET | Freq: Two times a day (BID) | ORAL | 0 refills | Status: AC

## 2024-03-03 ENCOUNTER — Ambulatory Visit (INDEPENDENT_AMBULATORY_CARE_PROVIDER_SITE_OTHER)

## 2024-03-03 DIAGNOSIS — J309 Allergic rhinitis, unspecified: Secondary | ICD-10-CM | POA: Diagnosis not present

## 2024-03-10 ENCOUNTER — Other Ambulatory Visit: Payer: Self-pay

## 2024-03-10 ENCOUNTER — Emergency Department (HOSPITAL_BASED_OUTPATIENT_CLINIC_OR_DEPARTMENT_OTHER)

## 2024-03-10 ENCOUNTER — Encounter (HOSPITAL_BASED_OUTPATIENT_CLINIC_OR_DEPARTMENT_OTHER): Payer: Self-pay | Admitting: Urology

## 2024-03-10 ENCOUNTER — Emergency Department (HOSPITAL_BASED_OUTPATIENT_CLINIC_OR_DEPARTMENT_OTHER)
Admission: EM | Admit: 2024-03-10 | Discharge: 2024-03-10 | Disposition: A | Discharge status: Home / Self Care | Attending: Emergency Medicine | Admitting: Emergency Medicine

## 2024-03-10 DIAGNOSIS — J45909 Unspecified asthma, uncomplicated: Secondary | ICD-10-CM | POA: Diagnosis not present

## 2024-03-10 DIAGNOSIS — M25512 Pain in left shoulder: Secondary | ICD-10-CM | POA: Insufficient documentation

## 2024-03-10 DIAGNOSIS — R0789 Other chest pain: Secondary | ICD-10-CM | POA: Insufficient documentation

## 2024-03-10 DIAGNOSIS — R0602 Shortness of breath: Secondary | ICD-10-CM | POA: Diagnosis not present

## 2024-03-10 DIAGNOSIS — R079 Chest pain, unspecified: Secondary | ICD-10-CM | POA: Diagnosis not present

## 2024-03-10 DIAGNOSIS — M542 Cervicalgia: Secondary | ICD-10-CM | POA: Insufficient documentation

## 2024-03-10 DIAGNOSIS — R202 Paresthesia of skin: Secondary | ICD-10-CM | POA: Diagnosis not present

## 2024-03-10 LAB — CBC
HCT: 37.3 % (ref 36.0–46.0)
Hemoglobin: 11.8 g/dL — ABNORMAL LOW (ref 12.0–15.0)
MCH: 26.1 pg (ref 26.0–34.0)
MCHC: 31.6 g/dL (ref 30.0–36.0)
MCV: 82.5 fL (ref 80.0–100.0)
Platelets: 162 K/uL (ref 150–400)
RBC: 4.52 MIL/uL (ref 3.87–5.11)
RDW: 14.9 % (ref 11.5–15.5)
WBC: 4 K/uL (ref 4.0–10.5)
nRBC: 0 % (ref 0.0–0.2)

## 2024-03-10 LAB — BASIC METABOLIC PANEL WITH GFR
Anion gap: 12 (ref 5–15)
BUN: 16 mg/dL (ref 6–20)
CO2: 24 mmol/L (ref 22–32)
Calcium: 9.6 mg/dL (ref 8.9–10.3)
Chloride: 103 mmol/L (ref 98–111)
Creatinine, Ser: 0.87 mg/dL (ref 0.44–1.00)
GFR, Estimated: >60 mL/min (ref >60)
Glucose, Bld: 85 mg/dL (ref 70–99)
Potassium: 3.6 mmol/L (ref 3.5–5.1)
Sodium: 138 mmol/L (ref 135–145)

## 2024-03-10 LAB — PREGNANCY, URINE: Preg Test, Ur: NEGATIVE

## 2024-03-10 LAB — TROPONIN T, HIGH SENSITIVITY
Troponin T High Sensitivity: <15 ng/L (ref 0–19)
Troponin T High Sensitivity: <15 ng/L (ref 0–19)

## 2024-03-10 NOTE — ED Triage Notes (Addendum)
 Pt states left side chest pain and left arm tingling that started Saturday  States some SOB with exertion  Denies any cardiac history  States left arm is tender to touch, limited ROM noted, denies any injury

## 2024-03-10 NOTE — Discharge Instructions (Addendum)
 I suspect that your symptoms today are likely secondary to a muscle sprain/strain/spasm.  Continue ibuprofen /Tylenol  and lidocaine  patches as needed for relief of your symptoms.  Return to the emergency department if your symptoms worsen.  Follow-up with your primary care provider, I have also provided you with the contact information for an orthopedic specialist, if your symptoms persist you may schedule follow-up with their office.

## 2024-03-10 NOTE — ED Provider Notes (Signed)
 [facility] [facility] Provider Note   CSN: [id number] Arrival date & time: 03/10/24  1519     Patient presents with: Chest Pain   Marie Kramer is a 48 y.o. female.   48 year old female presenting with left shoulder/chest wall/neck pain.  Symptoms began on Saturday, patient works for [employer] but denies any known trigger/inciting event/injury.  She states that her symptoms are exacerbated by movement of her left arm, particularly at the shoulder.  At 1 point on Saturday she did have complete numbness of her left arm, this has since resolved.  Denies pleuritic component of pain, shortness of breath, abdominal pain, lower extremity edema.   Chest Pain      Prior to Admission medications   Medication Sig Start Date End Date Taking? Authorizing Provider  albuterol  (PROAIR  HFA) 108 (90 Base) MCG/ACT inhaler Inhale 2 puffs into the lungs every 4 (four) hours as needed for wheezing or shortness of breath. 10/17/22   Cari Arlean HERO, FNP  albuterol  (PROVENTIL ) (2.5 MG/3ML) 0.083% nebulizer solution Take 2.5 mg by nebulization every 6 (six) hours as needed for wheezing or shortness of breath.    [provider]  Albuterol  Sulfate 2.5 MG/0.5ML NEBU Please specify directions, refills and quantity 10/12/21   Ambs, Arlean HERO, FNP  benzonatate  (TESSALON ) 200 MG capsule Take 200 mg by mouth 3 (three) times daily. 12/20/23   [provider]  budesonide -formoterol  (SYMBICORT ) 80-4.5 MCG/ACT inhaler Inhale 2 puffs into the lungs in the morning and at bedtime. 01/20/24   Marinda Rocky SAILOR, MD  ciclopirox  (LOPROX ) 0.77 % cream Apply topically 2 (two) times daily. 11/14/23   Rudy Carlin LABOR, MD  cromolyn  (OPTICROM ) 4 % ophthalmic solution Place 1 drop into both eyes 4 (four) times daily as needed. 01/20/24   Marinda Rocky SAILOR, MD  EPINEPHrine  (EPIPEN  2-PAK) 0.3 mg/0.3 mL IJ SOAJ injection EpiPen  2-Pak 0.3 mg/0.3 mL injection, auto-injector 12/28/20    Luke Orlan HERO, DO  Ferric Maltol  (ACCRUFER ) 30 MG CAPS Take 1 capsule (30 mg total) by mouth 2 (two) times daily before a meal. Take 2 hrs before, or 2 hrs after a meal. 08/31/22   Rudy Carlin LABOR, MD  fluticasone  (FLONASE ) 50 MCG/ACT nasal spray Place 1 spray into both nostrils daily. 01/20/24   Marinda Rocky SAILOR, MD  ibuprofen  (ADVIL ) 800 MG tablet Take 1 tablet (800 mg total) by mouth every 8 (eight) hours as needed. 11/14/23   Rudy Carlin LABOR, MD  levocetirizine (XYZAL ) 5 MG tablet Take 1 tablet (5 mg total) by mouth every evening. 01/20/24   Marinda Rocky SAILOR, MD  Prenat w/o A-FE-Methfol-FA-DHA (PNV-DHA ) 27-0.6-0.4-300 MG CAPS Take 1 capsule by mouth daily before breakfast. 11/14/23   Rudy Carlin LABOR, MD  rosuvastatin  (CRESTOR ) 40 MG tablet Take 1 tablet (40 mg total) by mouth daily. 09/02/23   Almarie Waddell NOVAK, NP    Allergies: Patient has no known allergies.    Review of Systems  Cardiovascular:  Positive for chest pain.    Updated Vital Signs  Vitals:   03/10/24 1537 03/10/24 1538 03/10/24 1745  BP:  112/71 104/61  Pulse:  73 63  Resp:  18 19  Temp:  98.1 F (36.7 C) 98.1 F (36.7 C)  SpO2:  100% 100%  Weight: 88.5 kg    Height: 5' 6 (1.676 m)       Physical Exam Vitals and nursing note reviewed.  HENT:     Head: Normocephalic.  Eyes:     Extraocular Movements: Extraocular movements intact.  Neck:     Comments: No midline C-spine TTP Minimal TTP in the distribution of the L trapezius No carotid bruits Cardiovascular:     Rate and Rhythm: Normal rate and regular rhythm.     Pulses:          Radial pulses are 2+ on the right side and 2+ on the left side.  Pulmonary:     Effort: Pulmonary effort is normal.     Breath sounds: Normal breath sounds.  Abdominal:     Palpations: Abdomen is soft.     Tenderness: There is no abdominal tenderness. There is no guarding.  Musculoskeletal:     Cervical back: Normal range of motion.     Comments: 5/5 strength against resistance of  bilateral upper extremities, no appreciable sensory deficit LUE: Limited overhead reach secondary to pain. TTP if the anterior aspect of shoulder/deltoid, no obvious swelling or deformity  Skin:    General: Skin is warm and dry.  Neurological:     Mental Status: She is alert and oriented to person, place, and time.     (all labs ordered are listed, but only abnormal results are displayed) Labs Reviewed  CBC - Abnormal; Notable for the following components:      Result Value   Hemoglobin 11.8 (*)    All other components within normal limits  BASIC METABOLIC PANEL WITH GFR  PREGNANCY, URINE  TROPONIN T, HIGH SENSITIVITY  TROPONIN T, HIGH SENSITIVITY    EKG: None  Radiology: DG Chest 2 View Result Date: 03/10/2024 CLINICAL DATA:  Chest pain and shortness of breath with left arm tingling. EXAM: CHEST - 2 VIEW COMPARISON:  March 06, 2016 FINDINGS: The heart size and mediastinal contours are within normal limits. Both lungs are clear. The visualized skeletal structures are unremarkable. IMPRESSION: No active cardiopulmonary disease. Electronically Signed   By: Suzen Dials M.D.   On: 03/10/2024 16:14     Procedures   Medications Ordered in the ED - No data to display  Clinical Course as of 03/10/24 1907  Tue Mar 10, 2024  1903 Left shoulder and chest wall pain, MSK, trap and deltoid  [LS]    Clinical Course User Index [LS] Rogelia Jerilynn RAMAN, MD                                 Medical Decision Making This patient presents to the ED for concern of left shoulder/neck/chest wall pain, this involves an extensive number of treatment options, and is a complaint that carries with it a high risk of complications and morbidity.  The differential diagnosis includes musculoskeletal sprain/strain/pain, ACS, pulmonary embolism, fracture/dislocation   Co morbidities that complicate the patient evaluation  Asthma, high cholesterol   Additional history obtained:  Additional  history obtained from record review External records from outside source obtained and reviewed including note from previous allergist   Lab Tests:  I Ordered, and personally interpreted labs.  The pertinent results include: CBC notable for hemoglobin of 11.8, however this is improved as compared to her most recent baseline of 11.2 from 7 months ago. Initial troponin < 15, repeat unchanged.   Imaging Studies ordered:  I ordered imaging studies including chest x-ray, XR shoulder I independently visualized and interpreted imaging which showed  - CXR: no active cardiopulmonary disease - XR shoulder: Negative I agree with the radiologist interpretation  Cardiac Monitoring: / EKG:  The patient was maintained on a cardiac monitor.  I personally viewed and interpreted the cardiac monitored which showed an underlying rhythm of: NSR   Problem List / ED Course / Critical interventions / Medication management  I have reviewed the patients home medicines and have made adjustments as needed   Social Determinants of Health:  Depression   Test / Admission - Considered:  PERC negative.  Vitals and workup reassuring, on examination patient's symptoms seem more musculoskeletal in origin, as she has tenderness to palpation in the distribution of the trapezius and deltoid.  I suspect that this is representative of a muscle strain, I encouraged her to continue ibuprofen /Tylenol  and lidocaine  patches as needed for relief of her symptoms.  I do not feel that patient's symptoms today are cardiac in origin, given reassuring workup as above.  Patient voiced understanding and is in agreement with this plan, return precautions discussed, she is appropriate for discharge at this time.    Amount and/or Complexity of Data Reviewed Labs: ordered. Radiology: ordered.        Final diagnoses:  Left shoulder pain, unspecified chronicity    ED Discharge Orders     None          Glendia Rocky LOISE DEVONNA 03/10/24 1927    Rogelia Jerilynn RAMAN, MD 03/14/24 0005

## 2024-03-11 ENCOUNTER — Ambulatory Visit (INDEPENDENT_AMBULATORY_CARE_PROVIDER_SITE_OTHER)

## 2024-03-11 DIAGNOSIS — J309 Allergic rhinitis, unspecified: Secondary | ICD-10-CM

## 2024-03-17 ENCOUNTER — Ambulatory Visit: Admitting: Orthopaedic Surgery

## 2024-03-18 ENCOUNTER — Ambulatory Visit (INDEPENDENT_AMBULATORY_CARE_PROVIDER_SITE_OTHER)

## 2024-03-18 DIAGNOSIS — J309 Allergic rhinitis, unspecified: Secondary | ICD-10-CM

## 2024-03-24 ENCOUNTER — Ambulatory Visit (INDEPENDENT_AMBULATORY_CARE_PROVIDER_SITE_OTHER)

## 2024-03-24 DIAGNOSIS — J309 Allergic rhinitis, unspecified: Secondary | ICD-10-CM | POA: Diagnosis not present

## 2024-03-31 ENCOUNTER — Ambulatory Visit (INDEPENDENT_AMBULATORY_CARE_PROVIDER_SITE_OTHER)

## 2024-03-31 DIAGNOSIS — J309 Allergic rhinitis, unspecified: Secondary | ICD-10-CM | POA: Diagnosis not present

## 2024-04-07 ENCOUNTER — Ambulatory Visit

## 2024-04-07 DIAGNOSIS — J309 Allergic rhinitis, unspecified: Secondary | ICD-10-CM

## 2024-04-14 ENCOUNTER — Ambulatory Visit: Admitting: Orthopaedic Surgery

## 2024-04-14 ENCOUNTER — Ambulatory Visit (INDEPENDENT_AMBULATORY_CARE_PROVIDER_SITE_OTHER)

## 2024-04-14 DIAGNOSIS — J309 Allergic rhinitis, unspecified: Secondary | ICD-10-CM | POA: Diagnosis not present

## 2024-04-20 ENCOUNTER — Ambulatory Visit

## 2024-04-20 DIAGNOSIS — J309 Allergic rhinitis, unspecified: Secondary | ICD-10-CM | POA: Diagnosis not present

## 2024-04-23 ENCOUNTER — Ambulatory Visit: Admitting: Orthopaedic Surgery

## 2024-04-28 ENCOUNTER — Ambulatory Visit

## 2024-04-28 DIAGNOSIS — J309 Allergic rhinitis, unspecified: Secondary | ICD-10-CM

## 2024-05-04 ENCOUNTER — Encounter: Payer: Self-pay | Admitting: Radiology

## 2024-05-05 ENCOUNTER — Ambulatory Visit: Admitting: Orthopaedic Surgery

## 2024-05-05 ENCOUNTER — Ambulatory Visit

## 2024-05-05 DIAGNOSIS — J309 Allergic rhinitis, unspecified: Secondary | ICD-10-CM | POA: Diagnosis not present

## 2024-05-08 ENCOUNTER — Encounter: Payer: Self-pay | Admitting: Obstetrics

## 2024-05-13 ENCOUNTER — Ambulatory Visit

## 2024-05-13 DIAGNOSIS — J309 Allergic rhinitis, unspecified: Secondary | ICD-10-CM

## 2024-05-20 ENCOUNTER — Ambulatory Visit (INDEPENDENT_AMBULATORY_CARE_PROVIDER_SITE_OTHER)

## 2024-05-20 DIAGNOSIS — J309 Allergic rhinitis, unspecified: Secondary | ICD-10-CM | POA: Diagnosis not present

## 2024-05-26 ENCOUNTER — Ambulatory Visit (INDEPENDENT_AMBULATORY_CARE_PROVIDER_SITE_OTHER)

## 2024-05-26 DIAGNOSIS — J309 Allergic rhinitis, unspecified: Secondary | ICD-10-CM | POA: Diagnosis not present

## 2024-05-27 ENCOUNTER — Ambulatory Visit: Admitting: Orthopaedic Surgery

## 2024-05-27 DIAGNOSIS — M25512 Pain in left shoulder: Secondary | ICD-10-CM

## 2024-05-27 NOTE — Progress Notes (Signed)
 Office Visit Note   Patient: Marie Kramer           Date of Birth: June 09, 1976           MRN: [id number] Visit Date: 05/27/2024              Requested by: Rudy Carlin LABOR, MD [address] [place],  [place] [address] PCP: Rudy Carlin LABOR, MD   Assessment & Plan: Visit Diagnoses:  1. Acute pain of left shoulder     Plan: History of Present Illness Judye K Donald Jacque is a 48 year old female who presents with persistent left shoulder pain.  She reports chronic left shoulder pain that persisted despite a corticosteroid injection in April, which relieved symptoms for only a few weeks before the pain recurred. She has not yet tried physical therapy for this shoulder.  Physical Exam MUSCULOSKELETAL: Left shoulder with guarding but good range of motion.  Motor and sensory function are intact with guarding.  Assessment and Plan Left shoulder pain Chronic pain with guarding, likely mediated rather than structural. Good range of motion. Differential includes inflammation. - Ordered shoulder MRI to assess structural issues.  Follow-Up Instructions: No follow-ups on file.   Orders:  Orders Placed This Encounter  Procedures   MR Shoulder Left w/o contrast   No orders of the defined types were placed in this encounter.     Procedures: No procedures performed   Clinical Data: No additional findings.   Subjective: Chief Complaint  Patient presents with   Left Shoulder - Pain    HPI  Review of Systems  Constitutional: Negative.   HENT: Negative.    Eyes: Negative.   Respiratory: Negative.    Cardiovascular: Negative.   Endocrine: Negative.   Musculoskeletal: Negative.   Neurological: Negative.   Hematological: Negative.   Psychiatric/Behavioral: Negative.    All other systems reviewed and are negative.    Objective: Vital Signs: There were no vitals taken for this visit.  Physical Exam Vitals and nursing note reviewed.   Constitutional:      Appearance: She is well-developed.  HENT:     Head: Atraumatic.     Nose: Nose normal.  Eyes:     Extraocular Movements: Extraocular movements intact.  Cardiovascular:     Pulses: Normal pulses.  Pulmonary:     Effort: Pulmonary effort is normal.  Abdominal:     Palpations: Abdomen is soft.  Musculoskeletal:     Cervical back: Neck supple.  Skin:    General: Skin is warm.     Capillary Refill: Capillary refill takes less than 2 seconds.  Neurological:     Mental Status: She is alert. Mental status is at baseline.  Psychiatric:        Behavior: Behavior normal.        Thought Content: Thought content normal.        Judgment: Judgment normal.     Ortho Exam  Specialty Comments:  No specialty comments available.  Imaging: No results found.   PMFS History: Patient Active Problem List   Diagnosis Date Noted   Seasonal and perennial allergic rhinitis 01/20/2024   Dry skin 01/20/2024   Pure hypercholesterolemia 09/02/2023   Iron deficiency anemia 09/02/2023   Prediabetes 09/02/2023   Allergic conjunctivitis of both eyes 10/25/2020   Moderate persistent asthma 07/10/2018   Seasonal and perennial allergic rhinoconjunctivitis 07/10/2018   Past Medical History:  Diagnosis Date   Allergies    Anemia 04/21/1992  Asthma    Bronchitis    Chronic pain of left knee 06/19/2016   Haglund's deformity of right heel 06/13/2018    Family History  Problem Relation Age of Onset   Allergic rhinitis Mother    Asthma Son    Allergic rhinitis Son    Asthma Daughter    Allergic rhinitis Daughter    Bronchitis Daughter    Asthma Son    Allergic rhinitis Son    Eczema Neg Hx    Urticaria Neg Hx    Immunodeficiency Neg Hx    Angioedema Neg Hx     Past Surgical History:  Procedure Laterality Date   BREAST BIOPSY Left 11/15/2022   MM LT BREAST BX W LOC DEV 1ST LESION IMAGE BX SPEC STEREO GUIDE 11/15/2022 [facility]   TUBAL LIGATION     Social  History   Occupational History   Not on file  Tobacco Use   Smoking status: Never   Smokeless tobacco: Never  Vaping Use   Vaping status: Never Used  Substance and Sexual Activity   Alcohol use: No   Drug use: No   Sexual activity: Yes    Birth control/protection: Condom, Surgical, None

## 2024-06-02 ENCOUNTER — Ambulatory Visit

## 2024-06-02 DIAGNOSIS — J309 Allergic rhinitis, unspecified: Secondary | ICD-10-CM | POA: Diagnosis not present

## 2024-06-04 ENCOUNTER — Telehealth: Payer: Self-pay | Admitting: Internal Medicine

## 2024-06-04 NOTE — Telephone Encounter (Signed)
 Thank you Isaiah. I appreciate it!

## 2024-06-04 NOTE — Telephone Encounter (Signed)
 Brittinee dropped off FMLA forms to be filled out by Dr. Marinda.  Patient paid $25.  Patient would like to be called at [phone] when forms are ready to be picked up.  FMLA forms placed in Dr. Marinda' basket in her office.

## 2024-06-10 ENCOUNTER — Ambulatory Visit (INDEPENDENT_AMBULATORY_CARE_PROVIDER_SITE_OTHER)

## 2024-06-10 DIAGNOSIS — J309 Allergic rhinitis, unspecified: Secondary | ICD-10-CM | POA: Diagnosis not present

## 2024-06-11 ENCOUNTER — Other Ambulatory Visit: Payer: Self-pay | Admitting: Obstetrics and Gynecology

## 2024-06-14 ENCOUNTER — Inpatient Hospital Stay
Admission: RE | Admit: 2024-06-14 | Discharge: 2024-06-14 | Attending: Orthopaedic Surgery | Admitting: Orthopaedic Surgery

## 2024-06-14 DIAGNOSIS — M25512 Pain in left shoulder: Secondary | ICD-10-CM

## 2024-06-15 ENCOUNTER — Ambulatory Visit: Payer: Self-pay | Admitting: Orthopaedic Surgery

## 2024-06-15 NOTE — Progress Notes (Signed)
Needs f/u appt.  Thanks.

## 2024-06-15 NOTE — Progress Notes (Signed)
 APPT MADE

## 2024-06-16 ENCOUNTER — Ambulatory Visit (INDEPENDENT_AMBULATORY_CARE_PROVIDER_SITE_OTHER)

## 2024-06-16 DIAGNOSIS — J309 Allergic rhinitis, unspecified: Secondary | ICD-10-CM | POA: Diagnosis not present

## 2024-06-23 ENCOUNTER — Ambulatory Visit: Payer: Self-pay

## 2024-06-23 ENCOUNTER — Other Ambulatory Visit (HOSPITAL_COMMUNITY)
Admission: RE | Admit: 2024-06-23 | Discharge: 2024-06-23 | Disposition: A | Discharge status: Home / Self Care | Source: Ambulatory Visit | Attending: Obstetrics and Gynecology | Admitting: Obstetrics and Gynecology

## 2024-06-23 ENCOUNTER — Ambulatory Visit: Payer: Self-pay | Admitting: Obstetrics and Gynecology

## 2024-06-23 VITALS — BP 113/74 | HR 54 | Wt 200.8 lb

## 2024-06-23 DIAGNOSIS — N898 Other specified noninflammatory disorders of vagina: Secondary | ICD-10-CM | POA: Insufficient documentation

## 2024-06-23 DIAGNOSIS — R350 Frequency of micturition: Secondary | ICD-10-CM | POA: Diagnosis not present

## 2024-06-23 DIAGNOSIS — R8290 Unspecified abnormal findings in urine: Secondary | ICD-10-CM | POA: Diagnosis not present

## 2024-06-23 LAB — POCT URINALYSIS DIPSTICK
Bilirubin, UA: NEGATIVE
Blood, UA: NEGATIVE
Glucose, UA: NEGATIVE
Ketones, UA: NEGATIVE
Nitrite, UA: NEGATIVE
Odor: NEGATIVE
Protein, UA: NEGATIVE
Spec Grav, UA: 1.015
Urobilinogen, UA: 0.2 U/dL
pH, UA: 5

## 2024-06-23 LAB — CERVICOVAGINAL ANCILLARY ONLY
Bacterial Vaginitis (gardnerella): POSITIVE — AB
Candida Glabrata: NEGATIVE
Candida Vaginitis: POSITIVE — AB
Chlamydia: NEGATIVE
Comment: NEGATIVE
Comment: NEGATIVE
Comment: NEGATIVE
Comment: NEGATIVE
Comment: NEGATIVE
Comment: NORMAL
Neisseria Gonorrhea: NEGATIVE
Trichomonas: NEGATIVE

## 2024-06-23 NOTE — Progress Notes (Signed)
..  SUBJECTIVE: Marie Kramer is a 48 y.o. female who complains of vaginal discharge, urinary frequency, urgency and odor x 7 days, denies flank pain, fever, chills, or bleeding.   OBJECTIVE: Appears well, in no apparent distress.  Vital signs are normal. Urine dipstick shows positive for leukocytes.    ASSESSMENT: Vaginal discharge and odor/frequent urination  PLAN: Treatment per orders.  Call or return to clinic prn if these symptoms worsen or fail to improve as anticipated.

## 2024-06-24 ENCOUNTER — Ambulatory Visit: Admitting: Orthopaedic Surgery

## 2024-06-24 DIAGNOSIS — M75112 Incomplete rotator cuff tear or rupture of left shoulder, not specified as traumatic: Secondary | ICD-10-CM | POA: Diagnosis not present

## 2024-06-24 DIAGNOSIS — M7542 Impingement syndrome of left shoulder: Secondary | ICD-10-CM

## 2024-06-24 MED ORDER — METRONIDAZOLE 0.75 % VA GEL: 1.0000 | Freq: Every day | VAGINAL | 0 refills | Status: AC

## 2024-06-24 MED ORDER — FLUCONAZOLE 150 MG PO TABS: 150.0000 mg | ORAL_TABLET | Freq: Once | ORAL | 0 refills | Status: AC

## 2024-06-24 NOTE — Progress Notes (Signed)
 "  Office Visit Note   Patient: Marie Kramer           Date of Birth: [birth year]           MRN: [id number] Visit Date: 06/24/2024              Requested by: Rudy Carlin LABOR, MD [id number] [place],  [place] [address] PCP: Rudy Carlin LABOR, MD   Assessment & Plan: Visit Diagnoses:  1. Impingement syndrome of left shoulder   2. Partial nontraumatic tear of left rotator cuff     Plan: History of Present Illness Torian J Aylene Acoff is a 48 year old female  who presents for follow-up and review of left shoulder MRI.  She has persistent left shoulder pain localized to the shoulder that is unchanged since her last visit.  She previously received corticosteroid injections without relief.  Recent MRI showed a partial thickness supraspinatus tendon tear and a downward-curved acromion.  Physical Exam Right shoulder: pain and slight weakness with manual muscle testing of supraspinatus.  pain with hawkin's impingement test.   normal ROM.  Results Radiology Right shoulder MRI: Partial thickness tear of the supraspinatus tendon; inferiorly curved acromion resulting in impingement; remaining shoulder structures unremarkable (Independently interpreted)  Assessment and Plan Partial-thickness rotator cuff tear with impingement, left shoulder Partial-thickness supraspinatus tendon tear with impingement from a downward-curved acromion. Symptoms persist despite ineffective corticosteroid injections. MRI shows partial-thickness tear of the supraspinatus tendon. Surgical intervention indicated due to failed conservative management. - Reviewed MRI findings. - detailed surgical plan including extensive debridement, SAD and possible RCR.  risks, benefits discussed. - Explained potential for rotator cuff repair if full-thickness tear is found intraoperatively. - Surgery scheduler to contact her for procedure date.  Follow-Up Instructions: No follow-ups on file.    Orders:  No orders of the defined types were placed in this encounter.  No orders of the defined types were placed in this encounter.     Procedures: No procedures performed   Clinical Data: No additional findings.   Subjective: Chief Complaint  Patient presents with   Left Shoulder - Pain    HPI  Review of Systems  Constitutional: Negative.   HENT: Negative.    Eyes: Negative.   Respiratory: Negative.    Cardiovascular: Negative.   Endocrine: Negative.   Musculoskeletal: Negative.   Neurological: Negative.   Hematological: Negative.   Psychiatric/Behavioral: Negative.    All other systems reviewed and are negative.    Objective: Vital Signs: There were no vitals taken for this visit.  Physical Exam Vitals and nursing note reviewed.  Constitutional:      Appearance: She is well-developed.  HENT:     Head: Atraumatic.     Nose: Nose normal.  Eyes:     Extraocular Movements: Extraocular movements intact.  Cardiovascular:     Pulses: Normal pulses.  Pulmonary:     Effort: Pulmonary effort is normal.  Abdominal:     Palpations: Abdomen is soft.  Musculoskeletal:     Cervical back: Neck supple.  Skin:    General: Skin is warm.     Capillary Refill: Capillary refill takes less than 2 seconds.  Neurological:     Mental Status: She is alert. Mental status is at baseline.  Psychiatric:        Behavior: Behavior normal.        Thought Content: Thought content normal.        Judgment: Judgment normal.  Ortho Exam  Specialty Comments:  No specialty comments available.  Imaging: No results found.   PMFS History: Patient Active Problem List   Diagnosis Date Noted   Impingement syndrome of left shoulder 06/24/2024   Partial nontraumatic tear of left rotator cuff 06/24/2024   Seasonal and perennial allergic rhinitis 01/20/2024   Dry skin 01/20/2024   Pure hypercholesterolemia 09/02/2023   Iron deficiency anemia 09/02/2023   Prediabetes  09/02/2023   Allergic conjunctivitis of both eyes 10/25/2020   Moderate persistent asthma 07/10/2018   Seasonal and perennial allergic rhinoconjunctivitis 07/10/2018   Past Medical History:  Diagnosis Date   Allergies    Anemia 04/21/1992   Asthma    Bronchitis    Chronic pain of left knee 06/19/2016   Haglund's deformity of right heel 06/13/2018    Family History  Problem Relation Age of Onset   Allergic rhinitis Mother    Asthma Son    Allergic rhinitis Son    Asthma Daughter    Allergic rhinitis Daughter    Bronchitis Daughter    Asthma Son    Allergic rhinitis Son    Eczema Neg Hx    Urticaria Neg Hx    Immunodeficiency Neg Hx    Angioedema Neg Hx     Past Surgical History:  Procedure Laterality Date   BREAST BIOPSY Left 11/15/2022   MM LT BREAST BX W LOC DEV 1ST LESION IMAGE BX SPEC STEREO GUIDE 11/15/2022 [facility]   TUBAL LIGATION     Social History   Occupational History   Not on file  Tobacco Use   Smoking status: Never   Smokeless tobacco: Never  Vaping Use   Vaping status: Never Used  Substance and Sexual Activity   Alcohol use: No   Drug use: No   Sexual activity: Yes    Birth control/protection: Condom, Surgical, None        "

## 2024-06-28 LAB — URINE CULTURE

## 2024-06-29 ENCOUNTER — Ambulatory Visit

## 2024-06-29 DIAGNOSIS — J309 Allergic rhinitis, unspecified: Secondary | ICD-10-CM | POA: Diagnosis not present

## 2024-06-29 MED ORDER — SULFAMETHOXAZOLE-TRIMETHOPRIM 800-160 MG PO TABS: 1.0000 | ORAL_TABLET | Freq: Two times a day (BID) | ORAL | 0 refills | Status: AC

## 2024-07-07 DIAGNOSIS — J309 Allergic rhinitis, unspecified: Secondary | ICD-10-CM

## 2024-07-09 ENCOUNTER — Ambulatory Visit: Payer: Self-pay | Admitting: Obstetrics

## 2024-07-09 ENCOUNTER — Encounter: Payer: Self-pay | Admitting: Obstetrics

## 2024-07-09 VITALS — BP 111/74 | HR 59 | Ht 64.0 in | Wt 195.5 lb

## 2024-07-09 DIAGNOSIS — Z01419 Encounter for gynecological examination (general) (routine) without abnormal findings: Secondary | ICD-10-CM

## 2024-07-09 NOTE — Progress Notes (Signed)
 Pt presents for annual. Pt last pap was 11/14/23 and last mammogram was on 12/26/23. Pt has no questions or concerns at this time.

## 2024-07-09 NOTE — Progress Notes (Signed)
 Patient is too early for Annual / Pap Smear.  She has no complaints. She elected to reschedule,   CARLIN RONAL CENTERS, MD, FACOG Attending Obstetrician & Gynecologist, [facility] for [facility], [facility], [place] 07/09/2024

## 2024-07-14 ENCOUNTER — Telehealth: Payer: Self-pay | Admitting: Orthopaedic Surgery

## 2024-07-14 NOTE — Telephone Encounter (Signed)
 Called patient to offer surgery date for left shoulder rotator cuff repair.  No answer left message on voicemail providing my name and direct number.

## 2024-07-15 ENCOUNTER — Ambulatory Visit (INDEPENDENT_AMBULATORY_CARE_PROVIDER_SITE_OTHER)

## 2024-07-15 DIAGNOSIS — J302 Other seasonal allergic rhinitis: Secondary | ICD-10-CM | POA: Diagnosis not present

## 2024-07-19 ENCOUNTER — Other Ambulatory Visit: Payer: Self-pay | Admitting: Obstetrics and Gynecology

## 2024-07-21 ENCOUNTER — Ambulatory Visit (INDEPENDENT_AMBULATORY_CARE_PROVIDER_SITE_OTHER)

## 2024-07-21 DIAGNOSIS — J302 Other seasonal allergic rhinitis: Secondary | ICD-10-CM | POA: Diagnosis not present

## 2024-07-21 DIAGNOSIS — J3089 Other allergic rhinitis: Secondary | ICD-10-CM

## 2024-07-30 ENCOUNTER — Ambulatory Visit

## 2024-07-30 DIAGNOSIS — J302 Other seasonal allergic rhinitis: Secondary | ICD-10-CM

## 2024-07-31 ENCOUNTER — Telehealth: Payer: Self-pay | Admitting: Internal Medicine

## 2024-07-31 NOTE — Telephone Encounter (Signed)
 Thank you Parry vaguely remember filling some out, but not sure if from this patient. I would have given it to the back staff on the larger side. Can we look to see if they are somewhere in the back? They are not in my box.

## 2024-07-31 NOTE — Telephone Encounter (Signed)
 I started filling them out back in December and placed in Dr. Marinda box to complete. Unsure where they went from there but there is another telephone encounter about them.

## 2024-07-31 NOTE — Telephone Encounter (Signed)
 Cloa dropped off FMLA paper work for Dr. Marinda.  She would like for us  to call her when completed so she can pick up the forms.  These forms were placed in Dr. Marinda' mailbox in her office at [facility].

## 2024-07-31 NOTE — Telephone Encounter (Signed)
 It looks like we have filled these out before for her.  Can someone please fill out these forms using her old forms and leave in my box to sign? Thanks

## 2024-08-05 ENCOUNTER — Ambulatory Visit

## 2024-08-05 DIAGNOSIS — J302 Other seasonal allergic rhinitis: Secondary | ICD-10-CM | POA: Diagnosis not present
